# Patient Record
Sex: Female | Born: 1950 | Race: White | Hispanic: No | Marital: Married | Smoking: Never smoker
Health system: Southern US, Community
[De-identification: ages and names within clinical notes are randomized; demographics above are authoritative.]

## PROBLEM LIST (undated history)

## (undated) DIAGNOSIS — E079 Disorder of thyroid, unspecified: Secondary | ICD-10-CM

## (undated) DIAGNOSIS — IMO0002 Reserved for concepts with insufficient information to code with codable children: Secondary | ICD-10-CM

## (undated) DIAGNOSIS — R000 Tachycardia, unspecified: Secondary | ICD-10-CM

## (undated) DIAGNOSIS — H269 Unspecified cataract: Secondary | ICD-10-CM

## (undated) DIAGNOSIS — T7840XA Allergy, unspecified, initial encounter: Secondary | ICD-10-CM

## (undated) HISTORY — PX: BREAST REDUCTION SURGERY: SHX8

## (undated) HISTORY — DX: Reserved for concepts with insufficient information to code with codable children: IMO0002

## (undated) HISTORY — DX: Disorder of thyroid, unspecified: E07.9

## (undated) HISTORY — DX: Unspecified cataract: H26.9

## (undated) HISTORY — PX: CHOLECYSTECTOMY: SHX55

## (undated) HISTORY — DX: Allergy, unspecified, initial encounter: T78.40XA

## (undated) HISTORY — PX: LASIK: SHX215

## (undated) HISTORY — PX: COLONOSCOPY: SHX174

## (undated) HISTORY — DX: Tachycardia, unspecified: R00.0

## (undated) HISTORY — PX: BREAST LUMPECTOMY: SHX2

## (undated) HISTORY — PX: TUBAL LIGATION: SHX77

---

## 2010-07-15 DIAGNOSIS — M353 Polymyalgia rheumatica: Secondary | ICD-10-CM

## 2010-07-15 HISTORY — DX: Polymyalgia rheumatica: M35.3

## 2013-08-23 ENCOUNTER — Ambulatory Visit (INDEPENDENT_AMBULATORY_CARE_PROVIDER_SITE_OTHER): Payer: BC Managed Care – PPO | Admitting: Podiatry

## 2013-08-23 ENCOUNTER — Ambulatory Visit (INDEPENDENT_AMBULATORY_CARE_PROVIDER_SITE_OTHER): Payer: BC Managed Care – PPO

## 2013-08-23 ENCOUNTER — Encounter: Payer: Self-pay | Admitting: Podiatry

## 2013-08-23 VITALS — BP 115/70 | HR 96 | Resp 16 | Ht 68.0 in | Wt 200.0 lb

## 2013-08-23 DIAGNOSIS — M779 Enthesopathy, unspecified: Secondary | ICD-10-CM

## 2013-08-23 DIAGNOSIS — M722 Plantar fascial fibromatosis: Secondary | ICD-10-CM

## 2013-08-23 DIAGNOSIS — M7660 Achilles tendinitis, unspecified leg: Secondary | ICD-10-CM

## 2013-08-23 MED ORDER — TRIAMCINOLONE ACETONIDE 10 MG/ML IJ SUSP
10.0000 mg | Freq: Once | INTRAMUSCULAR | Status: AC
  Administered 2013-08-23: 10 mg

## 2013-08-23 MED ORDER — TRIAMCINOLONE ACETONIDE 10 MG/ML IJ SUSP: 10.0000 mg | Freq: Once | INTRAMUSCULAR | Status: DC

## 2013-08-23 MED ORDER — DICLOFENAC SODIUM 75 MG PO TBEC: 75.0000 mg | DELAYED_RELEASE_TABLET | Freq: Two times a day (BID) | ORAL | Status: DC

## 2013-08-23 NOTE — Patient Instructions (Signed)

## 2013-08-23 NOTE — Progress Notes (Signed)
   Subjective:    Patient ID: Anita Nguyen, female    DOB: 07/11/1951, 63 y.o.   MRN: [id number]  HPI Comments: "My feet are hurting, hope he can fix it"  Patient states that she has sharp pain in plantar heel right for about 6 months and is getting worse. She tries to walk for exercise but is unable to now. Driving, flat shoes, and walking a lot aggravates the problem. She says that her friend wears a night splint and says it helps and is interested in that. She says she tries to keep feet in a 90 degree position and it helps. Wears sneakers when she can and that relieves some pain. Her back doc has her wear a heel left in right shoe and it is now uncomfortable.  Patient says she also has intermittent pain in the achilles area and plantar forefoot. No swelling or injury. Thinks maybe compensation.  Foot Pain      Review of Systems  All other systems reviewed and are negative.       Objective:   Physical Exam        Assessment & Plan:

## 2013-08-24 ENCOUNTER — Telehealth: Payer: Self-pay | Admitting: *Deleted

## 2013-08-24 MED ORDER — DICLOFENAC SODIUM 75 MG PO TBEC: 75.0000 mg | DELAYED_RELEASE_TABLET | Freq: Two times a day (BID) | ORAL | Status: DC

## 2013-08-24 NOTE — Telephone Encounter (Signed)
Pt states [facility] in [place] is not the pharmacy she wants to use.  Please change the Voltaren 75mg  to [facility] in [place].  Rx sent to [facility] electronically.

## 2013-08-24 NOTE — Progress Notes (Signed)
Subjective:     Patient ID: Anita Nguyen, female   DOB: 1951-03-13, 63 y.o.   MRN: [id number]  Foot Pain   patient presents stating she's had about 6 month history of plantar pain in the right heel that is gradually getting more aggravating for her and making walking difficult. She's tried changes in shoe gear and changes in activity levels to help   Review of Systems  All other systems reviewed and are negative.       Objective:   Physical Exam  Nursing note and vitals reviewed. Constitutional: She is oriented to person, place, and time.  Cardiovascular: Intact distal pulses.   Musculoskeletal: Normal range of motion.  Neurological: She is oriented to person, place, and time.  Skin: Skin is warm.   neurovascular status is intact with normal muscle strength and no equinus condition noted of both feet. I noted exquisite discomfort plantar aspect right heel at the insertional point of the tendon into the calcaneus with inflammation and fluid around the medial band    Assessment:     Plantar fasciitis acute nature right heel with some compensation Achilles and forefoot    Plan:     H&P and x-ray reviewed. Injected the right plantar fascia 3 mg Kenalog 5 monocytes Marcaine mixture and dispensed fascially brace with instructions on usage. Placed on anti-inflammatory and reappoint one week

## 2013-09-02 ENCOUNTER — Ambulatory Visit: Payer: BC Managed Care – PPO | Admitting: Podiatry

## 2013-09-13 ENCOUNTER — Ambulatory Visit (INDEPENDENT_AMBULATORY_CARE_PROVIDER_SITE_OTHER): Payer: BC Managed Care – PPO | Admitting: Podiatry

## 2013-09-13 ENCOUNTER — Encounter: Payer: Self-pay | Admitting: Podiatry

## 2013-09-13 VITALS — BP 117/78 | HR 93 | Resp 16

## 2013-09-13 DIAGNOSIS — M722 Plantar fascial fibromatosis: Secondary | ICD-10-CM

## 2013-09-15 NOTE — Progress Notes (Signed)
Subjective:     Patient ID: Anita Nguyen, female   DOB: September 09, 1950, 63 y.o.   MRN: [id number]  HPI patient presents with heel pain that has improved but is still sore. She is on her feet a lot and develops discomfort with activity and cement floors and feels like her feet are not supported well   Review of Systems     Objective:   Physical Exam Neurovascular status was found to be intact with moderate inflammation in the plantar heel at the insertional point of the tendon into the calcaneus    Assessment:     Plantar fasciitis of the heel left that is improved but still present with mechanical dysfunction of the arch noted    Plan:     Reviewed physical therapy and also utilization of supportive shoe gear. Scanned for custom orthotics to reduce stress against the plantar feet

## 2013-10-29 ENCOUNTER — Ambulatory Visit (INDEPENDENT_AMBULATORY_CARE_PROVIDER_SITE_OTHER): Payer: BC Managed Care – PPO | Admitting: *Deleted

## 2013-10-29 DIAGNOSIS — M779 Enthesopathy, unspecified: Secondary | ICD-10-CM

## 2013-10-29 NOTE — Patient Instructions (Signed)

## 2013-10-29 NOTE — Progress Notes (Signed)
   Subjective:    Patient ID: Anita Nguyen, female    DOB: 05/03/1951, 62 y.o.   MRN: [id number]  HPI PICK UP ORTHOTICS AND GIVEN INSTRUCTION.   Review of Systems     Objective:   Physical Exam        Assessment & Plan:   

## 2013-12-02 ENCOUNTER — Ambulatory Visit: Payer: BC Managed Care – PPO | Admitting: Podiatry

## 2013-12-08 ENCOUNTER — Ambulatory Visit (INDEPENDENT_AMBULATORY_CARE_PROVIDER_SITE_OTHER): Payer: BC Managed Care – PPO | Admitting: Podiatry

## 2013-12-08 ENCOUNTER — Encounter: Payer: Self-pay | Admitting: Podiatry

## 2013-12-08 DIAGNOSIS — M722 Plantar fascial fibromatosis: Secondary | ICD-10-CM

## 2013-12-08 NOTE — Progress Notes (Signed)
Subjective:     Patient ID: Anita Nguyen, female   DOB: 17-Oct-1950, 63 y.o.   MRN: [id number]  HPI patient states that her orthotics do not fit in dress shoes plantar heel it bothers her time   Review of Systems     Objective:   Physical Exam Vascular status unchanged with pain in the plantar right heel the moderate nature and orthotics to do well in 10 issues but not in her dress shoes    Assessment:     Continue plantar fasciitis right with problems with dress shoes    Plan:     Acute condition and the difficulty of the tight shoe she wears but we are to try to make a different type of an orthotic. We scanned her and will have a dress-type orthotic made

## 2013-12-31 ENCOUNTER — Ambulatory Visit (INDEPENDENT_AMBULATORY_CARE_PROVIDER_SITE_OTHER): Payer: BC Managed Care – PPO | Admitting: *Deleted

## 2013-12-31 DIAGNOSIS — M722 Plantar fascial fibromatosis: Secondary | ICD-10-CM

## 2013-12-31 NOTE — Patient Instructions (Signed)

## 2013-12-31 NOTE — Progress Notes (Signed)
   Subjective:    Patient ID: Anita Nguyen, female    DOB: 10-11-1950, 63 y.o.   MRN: [id number]  HPI PICK UP ORTHOTICS AND GIVEN INSTRUCTION.   Review of Systems     Objective:   Physical Exam        Assessment & Plan:

## 2014-07-13 DIAGNOSIS — M722 Plantar fascial fibromatosis: Secondary | ICD-10-CM

## 2015-06-19 ENCOUNTER — Ambulatory Visit: Payer: Self-pay | Admitting: Podiatry

## 2017-05-08 ENCOUNTER — Ambulatory Visit (INDEPENDENT_AMBULATORY_CARE_PROVIDER_SITE_OTHER): Payer: BLUE CROSS/BLUE SHIELD

## 2017-05-08 ENCOUNTER — Ambulatory Visit (INDEPENDENT_AMBULATORY_CARE_PROVIDER_SITE_OTHER): Payer: BLUE CROSS/BLUE SHIELD | Admitting: Podiatry

## 2017-05-08 DIAGNOSIS — M722 Plantar fascial fibromatosis: Secondary | ICD-10-CM

## 2017-05-08 DIAGNOSIS — M2011 Hallux valgus (acquired), right foot: Secondary | ICD-10-CM

## 2017-05-08 DIAGNOSIS — D169 Benign neoplasm of bone and articular cartilage, unspecified: Secondary | ICD-10-CM

## 2017-05-08 DIAGNOSIS — M2012 Hallux valgus (acquired), left foot: Secondary | ICD-10-CM

## 2017-05-08 NOTE — Progress Notes (Signed)
Subjective:    Patient ID: Anita Nguyen, female   DOB: 66 y.o.   MRN: [id number]   HPI patient presents stating I need new orthotics and I'm having a lot of pain on the side of my first metatarsal left and on top of my left foot with bone spur formation that's making it difficult to wear shoe gear comfortably    Review of Systems  All other systems reviewed and are negative.       Objective:  Physical Exam  Constitutional: She appears well-developed and well-nourished.  Cardiovascular: Intact distal pulses.   Pulmonary/Chest: Effort normal.  Musculoskeletal: Normal range of motion.  Neurological: She is alert.  Skin: Skin is warm.  Nursing note and vitals reviewed.  neurovascular status intact muscle strength adequate range of motion within normal limits with patient found to have hyperostosis with pain first metatarsal left with a spur on top of the first metatarsocuneiform joint left it's painful when pressed. 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 Due to long-standing nature I do think this would be best corrected and I have recommended a modified type McBride bunionectomy left and removal of bone spur left. Patient wants surgery and we also discussed long-term orthotics and we'll order her a new pair of orthotics for the postoperative period patient is allowed to read consent form going over alternative treatments complications and the surgery and the fact total recovery can take approximate 6 months. Patient is comfortable with this wants surgery signed consent form is given all preoperative instructions and is encouraged to call with any questions prior to procedure  X-rays indicate enlargement of the first metatarsal head left  with dorsal spurring metatarsocuneiform joint left

## 2017-05-08 NOTE — Patient Instructions (Signed)

## 2017-05-09 ENCOUNTER — Telehealth: Payer: Self-pay | Admitting: *Deleted

## 2017-05-09 NOTE — Telephone Encounter (Signed)
"  I am going to have surgery on my left foot.  I don't know what to do next.  I want to wait on doing my surgery.  I have a $400 deductible that I haven't met.  I want to get some orthotics.  I would like to do everything in the same year so I won't have to make that deductible twice.  Please give me a call and let me know what I need to do."

## 2017-05-15 NOTE — Telephone Encounter (Signed)
I attempted to call the patient to find out what she decided to do.  I want to know if she's going to have the surgery this year or wait until next year.  Her voicemail was full, I couldn't leave a message.

## 2017-08-26 ENCOUNTER — Emergency Department (INDEPENDENT_AMBULATORY_CARE_PROVIDER_SITE_OTHER)
Admission: EM | Admit: 2017-08-26 | Discharge: 2017-08-26 | Disposition: A | Discharge status: Home / Self Care | Payer: BLUE CROSS/BLUE SHIELD | Source: Home / Self Care | Attending: Family Medicine | Admitting: Family Medicine

## 2017-08-26 ENCOUNTER — Other Ambulatory Visit: Payer: Self-pay

## 2017-08-26 DIAGNOSIS — J069 Acute upper respiratory infection, unspecified: Secondary | ICD-10-CM | POA: Diagnosis not present

## 2017-08-26 DIAGNOSIS — B9789 Other viral agents as the cause of diseases classified elsewhere: Secondary | ICD-10-CM

## 2017-08-26 LAB — POCT RAPID STREP A (OFFICE): Rapid Strep A Screen: NEGATIVE

## 2017-08-26 MED ORDER — AZITHROMYCIN 250 MG PO TABS: ORAL_TABLET | ORAL | 0 refills | Status: DC

## 2017-08-26 NOTE — ED Triage Notes (Signed)
Pt had cold sx 3 weeks ago, and they will not go away.  She has a cough and scratchy throat, and been exposed to strep.

## 2017-08-26 NOTE — Discharge Instructions (Signed)
Take plain guaifenesin (600mg  or1200mg  extended release tabs such as Mucinex) twice daily, with plenty of water, for cough and congestion.  May add Pseudoephedrine (30mg , one or two every 4 to 6 hours) for sinus congestion.  Get adequate rest.   May use Afrin nasal spray (or generic oxymetazoline) each morning for about 5 days and then discontinue.  Also recommend using saline nasal spray several times daily and saline nasal irrigation (AYR is a common brand).  Use Flonase nasal spray each morning after using Afrin nasal spray and saline nasal irrigation. Try warm salt water gargles for sore throat.  Stop all antihistamines for now, and other non-prescription cough/cold preparations. May take Delsym Cough Suppressant at bedtime for nighttime cough.

## 2017-08-26 NOTE — ED Provider Notes (Signed)
[name]    CSN: [id number] Arrival date & time: 08/26/17  1355     History   Chief Complaint Chief Complaint  Patient presents with  . Cough  . Nasal Congestion    HPI Anita Nguyen is a 67 y.o. female.   Patient reports that she had typical cold symptoms three weeks ago that seemed to be resolving.  Five days ago she developed sore throat, post-nasal drainage, frontal headache, and increased cough.  No fevers, chills, and sweats. She has a history of seasonal rhinitis.   The history is provided by the patient.    Past Medical History:  Diagnosis Date  . DDD (degenerative disc disease)   . Tachycardia   . Thyroid disease     There are no active problems to display for this patient.   Past Surgical History:  Procedure Laterality Date  . BREAST LUMPECTOMY    . BREAST REDUCTION SURGERY    . CHOLECYSTECTOMY    . LASIK      OB History    No data available       Home Medications    Prior to Admission medications   Medication Sig Start Date End Date Taking? Authorizing Provider  azithromycin (ZITHROMAX Z-PAK) 250 MG tablet Take 2 tabs today; then begin one tab once daily for 4 more days. 08/26/17   Kandra Nicolas, MD  Calcium Acetate, Phos Binder, (CALCIUM ACETATE PO) Take by mouth.    [provider]  Cholecalciferol (VITAMIN D PO) Take by mouth.    [provider]  diclofenac (VOLTAREN) 75 MG EC tablet Take 1 tablet (75 mg total) by mouth 2 (two) times daily. 08/24/13   Wallene Huh, DPM  levothyroxine (SYNTHROID, LEVOTHROID) 137 MCG tablet  07/24/13   [provider]  MAGNESIUM CARBONATE PO Take by mouth.    [provider]    Family History History reviewed. No pertinent family history.  Social History Social History   Tobacco Use  . Smoking status: Never Smoker  Substance Use Topics  . Alcohol use: No  . Drug use: Not on file     Allergies   Penicillins   Review of Systems Review of  Systems + sore throat + cough No pleuritic pain No wheezing + nasal congestion + post-nasal drainage No sinus pain/pressure No itchy/red eyes No earache No hemoptysis No SOB No fever, + chills No nausea No vomiting No abdominal pain No diarrhea No urinary symptoms No skin rash + fatigue No myalgias + headache Used OTC meds without relief   Physical Exam Triage Vital Signs ED Triage Vitals  Enc Vitals Group     BP 08/26/17 1544 (!) 143/84     Pulse Rate 08/26/17 1544 92     Resp --      Temp 08/26/17 1544 97.8 F (36.6 C)     Temp Source 08/26/17 1544 Oral     SpO2 08/26/17 1544 97 %     Weight 08/26/17 1545 216 lb (98 kg)     Height 08/26/17 1545 5\' 8"  (1.727 m)     Head Circumference --      Peak Flow --      Pain Score 08/26/17 1545 0     Pain Loc --      Pain Edu? --      Excl. in [place]? --    No data found.  Updated Vital Signs BP (!) 143/84 (BP Location: Right Arm)   Pulse 92  Temp 97.8 F (36.6 C) (Oral)   Ht 5\' 8"  (1.727 m)   Wt 216 lb (98 kg)   SpO2 97%   BMI 32.84 kg/m   Visual Acuity Right Eye Distance:   Left Eye Distance:   Bilateral Distance:    Right Eye Near:   Left Eye Near:    Bilateral Near:     Physical Exam Nursing notes and Vital Signs reviewed. Appearance:  Patient appears stated age, and in no acute distress Eyes:  Pupils are equal, round, and reactive to light and accomodation.  Extraocular movement is intact.  Conjunctivae are not inflamed  Ears:  Canals normal.  Tympanic membranes normal.  Nose:  Mildly congested turbinates.  No sinus tenderness.    Pharynx:  Uvula erythematous  Neck:  Supple.  Enlarged posterior/lateral nodes are palpated bilaterally, tender to palpation on the left.   Lungs:  Clear to auscultation.  Breath sounds are equal.  Moving air well. Heart:  Regular rate and rhythm without murmurs, rubs, or gallops.  Abdomen:  Nontender without masses or hepatosplenomegaly.  Bowel sounds are present.  No CVA  or flank tenderness.  Extremities:  No edema.  Skin:  No rash present.    UC Treatments / Results  Labs (all labs ordered are listed, but only abnormal results are displayed) Labs Reviewed  STREP A DNA PROBE  POCT RAPID STREP A (OFFICE) negative    EKG  EKG Interpretation None       Radiology No results found.  Procedures Procedures (including critical care time)  Medications Ordered in UC Medications - No data to display   Initial Impression / Assessment and Plan / UC Course  I have reviewed the triage vital signs and the nursing notes.  Pertinent labs & imaging results that were available during my care of the patient were reviewed by me and considered in my medical decision making (see chart for details).    Suspect a second viral URI. Throat culture pending. Begin empiric Z-pak. Take plain guaifenesin (600mg  or1200mg  extended release tabs such as Mucinex) twice daily, with plenty of water, for cough and congestion.  May add Pseudoephedrine (30mg , one or two every 4 to 6 hours) for sinus congestion.  Get adequate rest.   May use Afrin nasal spray (or generic oxymetazoline) each morning for about 5 days and then discontinue.  Also recommend using saline nasal spray several times daily and saline nasal irrigation (AYR is a common brand).  Use Flonase nasal spray each morning after using Afrin nasal spray and saline nasal irrigation. Try warm salt water gargles for sore throat.  Stop all antihistamines for now, and other non-prescription cough/cold preparations. May take Delsym Cough Suppressant at bedtime for nighttime cough.  Followup with Family Doctor if not improved in one week.     Final Clinical Impressions(s) / UC Diagnoses   Final diagnoses:  Viral URI with cough    ED Discharge Orders        Ordered    azithromycin (ZITHROMAX Z-PAK) 250 MG tablet     08/26/17 1630           Kandra Nicolas, MD 09/06/17 1100

## 2017-08-27 ENCOUNTER — Telehealth: Payer: Self-pay

## 2017-08-27 LAB — STREP A DNA PROBE: Group A Strep Probe: NOT DETECTED

## 2017-08-27 NOTE — Telephone Encounter (Signed)
Still feeling bad.  Given lab results.  Will follow up as needed.

## 2019-08-23 ENCOUNTER — Encounter: Payer: Self-pay | Admitting: Family Medicine

## 2019-08-23 ENCOUNTER — Ambulatory Visit (INDEPENDENT_AMBULATORY_CARE_PROVIDER_SITE_OTHER): Payer: Medicare Other | Admitting: Family Medicine

## 2019-08-23 ENCOUNTER — Other Ambulatory Visit: Payer: Self-pay

## 2019-08-23 VITALS — BP 121/75 | HR 80 | Temp 97.7°F | Wt 193.1 lb

## 2019-08-23 DIAGNOSIS — Z780 Asymptomatic menopausal state: Secondary | ICD-10-CM | POA: Insufficient documentation

## 2019-08-23 DIAGNOSIS — R21 Rash and other nonspecific skin eruption: Secondary | ICD-10-CM | POA: Diagnosis not present

## 2019-08-23 DIAGNOSIS — Z13820 Encounter for screening for osteoporosis: Secondary | ICD-10-CM | POA: Insufficient documentation

## 2019-08-23 DIAGNOSIS — Z1211 Encounter for screening for malignant neoplasm of colon: Secondary | ICD-10-CM | POA: Diagnosis not present

## 2019-08-23 NOTE — Assessment & Plan Note (Signed)
Refer to GI for updated colon cancer screening.

## 2019-08-23 NOTE — Assessment & Plan Note (Signed)
Rash appears most consistent with contact/irritant dermatitis.  She has had some relief with benadryl cream.  Recommend using cortisone cream instead for now, avoiding eyes/eyelids.  I think the rash is likely coincidental rather than causative from the COVID-19 vaccine.  I did recommend she stay an additional 15 minutes after second vaccine.  Recommend that she check cosmetic products for quaternium-15 or similar compounds.  Call for new or worsening symptoms.

## 2019-08-23 NOTE — Patient Instructions (Addendum)
Try using cortisone cream on the face.  Avoid getting on the eyelids.  Check to see if any of your make-up or hair products contain quaternium-15 I think you should be ok to get your 2nd COVID vaccine, I would recommend having them monitor you for 30 minutes afterwards.   I have entered orders for update colonoscopy and bone density test.  You should wait at least 14 days before receiving other vaccines (shingles/pneumonia) after your COVID vaccine

## 2019-08-23 NOTE — Assessment & Plan Note (Signed)
Needs bone density screening, ordered.

## 2019-08-23 NOTE — Progress Notes (Signed)
Anita Nguyen - 69 y.o. female MRN [id number]  Date of birth: July 24, 1950  Subjective Chief Complaint  Patient presents with  . Establish Care    HPI Anita Nguyen is a 69 y.o. female with history of hypothyroidism her today for initial visit and c/o facial rash.  She reports that symptoms started about 2 days ago.  Rash is red and itchy and located only around face.  She denies pain associated with rash.  She received COVID-19 vaccine on 08/16/19, not sure if this is related.  She denies any significant side effects 1-2 days after vaccine.  She has no prior reactions to vaccines.  She does report that she recently switched her laundry detergent, started using a new makeup and the conditioner that she uses has caused rash to her face in the past.    She also reports that she is overdue for colonoscopy and would like referral.  She is due for bone density test.   ROS:  A comprehensive ROS was completed and negative except as noted per HPI  Allergies  Allergen Reactions  . Penicillins     Past Medical History:  Diagnosis Date  . DDD (degenerative disc disease)   . Tachycardia   . Thyroid disease     Past Surgical History:  Procedure Laterality Date  . BREAST LUMPECTOMY    . BREAST REDUCTION SURGERY    . CHOLECYSTECTOMY    . LASIK    . TUBAL LIGATION      Social History   Socioeconomic History  . Marital status: Married    Spouse name: Not on file  . Number of children: 1  . Years of education: Not on file  . Highest education level: Not on file  Occupational History  . Occupation: Water engineer: [employer]  Tobacco Use  . Smoking status: Never Smoker  . Smokeless tobacco: Never Used  Substance and Sexual Activity  . Alcohol use: No  . Drug use: Never  . Sexual activity: Yes    Partners: Male    Birth control/protection: Post-menopausal  Other Topics Concern  . Not on file  Social History Narrative  . Not on file   Social Determinants of Health   Financial  Resource Strain:   . Difficulty of Paying Living Expenses: Not on file  Food Insecurity:   . Worried About Charity fundraiser in the Last Year: Not on file  . Ran Out of Food in the Last Year: Not on file  Transportation Needs:   . Lack of Transportation (Medical): Not on file  . Lack of Transportation (Non-Medical): Not on file  Physical Activity:   . Days of Exercise per Week: Not on file  . Minutes of Exercise per Session: Not on file  Stress:   . Feeling of Stress : Not on file  Social Connections:   . Frequency of Communication with Friends and Family: Not on file  . Frequency of Social Gatherings with Friends and Family: Not on file  . Attends Religious Services: Not on file  . Active Member of Clubs or Organizations: Not on file  . Attends Archivist Meetings: Not on file  . Marital Status: Not on file    Family History  Problem Relation Age of Onset  . Dementia Mother   . Dementia Sister   . Stomach cancer Father     Health Maintenance  Topic Date Due  . Hepatitis C Screening  1951/01/15  . MAMMOGRAM  02/17/2003  .  COLONOSCOPY  02/17/2003  . DEXA SCAN  02/16/2018  . PNA vac Low Risk Adult (1 of 2 - PCV13) 02/16/2018  . TETANUS/TDAP  08/22/2020 (Originally 02/17/1972)  . INFLUENZA VACCINE  Completed    ----------------------------------------------------------------------------------------------------------------------------------------------------------------------------------------------------------------- Physical Exam BP 121/75 (BP Location: Left Arm, Patient Position: Sitting, Cuff Size: Normal)   Pulse 80   Temp 97.7 F (36.5 C) (Oral)   Wt 193 lb 1.9 oz (87.6 kg)   BMI 29.36 kg/m   Physical Exam Constitutional:      Appearance: Normal appearance.  HENT:     Head: Normocephalic and atraumatic.  Eyes:     General: No scleral icterus. Cardiovascular:     Rate and Rhythm: Normal rate.  Pulmonary:     Effort: Pulmonary effort is normal.      Breath sounds: Normal breath sounds.  Musculoskeletal:     Cervical back: Neck supple.  Skin:    Comments: Erythema throughout face without swelling, hives or periorbital edema.   Neurological:     General: No focal deficit present.     Mental Status: She is alert.  Psychiatric:        Mood and Affect: Mood normal.        Behavior: Behavior normal.     ------------------------------------------------------------------------------------------------------------------------------------------------------------------------------------------------------------------- Assessment and Plan  Rash Rash appears most consistent with contact/irritant dermatitis.  She has had some relief with benadryl cream.  Recommend using cortisone cream instead for now, avoiding eyes/eyelids.  I think the rash is likely coincidental rather than causative from the COVID-19 vaccine.  I did recommend she stay an additional 15 minutes after second vaccine.  Recommend that she check cosmetic products for quaternium-15 or similar compounds.  Call for new or worsening symptoms.   Colon cancer screening Refer to GI for updated colon cancer screening.   Post-menopausal Needs bone density screening, ordered.      This visit occurred during the SARS-CoV-2 public health emergency.  Safety protocols were in place, including screening questions prior to the visit, additional usage of staff PPE, and extensive cleaning of exam room while observing appropriate contact time as indicated for disinfecting solutions.

## 2019-08-25 ENCOUNTER — Ambulatory Visit (INDEPENDENT_AMBULATORY_CARE_PROVIDER_SITE_OTHER): Payer: Medicare Other

## 2019-08-25 ENCOUNTER — Other Ambulatory Visit: Payer: Self-pay

## 2019-08-25 DIAGNOSIS — Z13820 Encounter for screening for osteoporosis: Secondary | ICD-10-CM | POA: Diagnosis not present

## 2019-08-25 DIAGNOSIS — Z780 Asymptomatic menopausal state: Secondary | ICD-10-CM

## 2019-08-30 ENCOUNTER — Encounter: Payer: Self-pay | Admitting: Gastroenterology

## 2019-09-21 ENCOUNTER — Other Ambulatory Visit: Payer: Self-pay

## 2019-09-21 ENCOUNTER — Ambulatory Visit (AMBULATORY_SURGERY_CENTER): Payer: Self-pay | Admitting: *Deleted

## 2019-09-21 VITALS — Temp 96.6°F | Ht 68.0 in | Wt 187.0 lb

## 2019-09-21 DIAGNOSIS — Z1211 Encounter for screening for malignant neoplasm of colon: Secondary | ICD-10-CM

## 2019-09-21 NOTE — Progress Notes (Signed)
No egg or soy allergy known to patient  No issues with past sedation with any surgeries  or procedures, no intubation problems  No diet pills per patient No home 02 use per patient  No blood thinners per patient  Pt denies issues with constipation  No A fib or A flutter  EMMI video sent to pt's e mail   Due to the COVID-19 pandemic we are asking patients to follow these guidelines. Please only bring one care partner. Please be aware that your care partner may wait in the car in the parking lot or if they feel like they will be too hot to wait in the car, they may wait in the lobby on the 4th floor. All care partners are required to wear a mask the entire time (we do not have any that we can provide them), they need to practice social distancing, and we will do a Covid check for all patient's and care partners when you arrive. Also we will check their temperature and your temperature. If the care partner waits in their car they need to stay in the parking lot the entire time and we will call them on their cell phone when the patient is ready for discharge so they can bring the car to the front of the building. Also all patient's will need to wear a mask into building.  Pt completed COVID vaccines 09-14-2019 so will not require a pre covid screen for her colon 3-23

## 2019-10-05 ENCOUNTER — Encounter: Payer: Self-pay | Admitting: Gastroenterology

## 2019-10-05 ENCOUNTER — Encounter: Payer: Medicare Other | Admitting: Gastroenterology

## 2019-10-05 ENCOUNTER — Other Ambulatory Visit: Payer: Self-pay

## 2019-10-05 ENCOUNTER — Ambulatory Visit (AMBULATORY_SURGERY_CENTER): Payer: Medicare Other | Admitting: Gastroenterology

## 2019-10-05 VITALS — BP 111/59 | HR 69 | Temp 97.1°F | Resp 12 | Ht 68.0 in | Wt 194.6 lb

## 2019-10-05 DIAGNOSIS — Z1211 Encounter for screening for malignant neoplasm of colon: Secondary | ICD-10-CM | POA: Diagnosis present

## 2019-10-05 DIAGNOSIS — D122 Benign neoplasm of ascending colon: Secondary | ICD-10-CM | POA: Diagnosis not present

## 2019-10-05 MED ORDER — SODIUM CHLORIDE 0.9 % IV SOLN: 500.0000 mL | INTRAVENOUS | Status: DC

## 2019-10-05 NOTE — Op Note (Signed)
Anita Nguyen Patient Name: Anita Nguyen Procedure Date: 10/05/2019 10:13 AM MRN: [id number] Endoscopist: Jackquline Denmark , MD Age: 69 Referring MD:  Date of Birth: 1950-10-19 Gender: Female Account #: [id number] Procedure:                Colonoscopy Indications:              Screening for colorectal malignant neoplasm Medicines:                Monitored Anesthesia Care Procedure:                Pre-Anesthesia Assessment:                           - Prior to the procedure, a History and Physical                            was performed, and patient medications and                            allergies were reviewed. The patient's tolerance of                            previous anesthesia was also reviewed. The risks                            and benefits of the procedure and the sedation                            options and risks were discussed with the patient.                            All questions were answered, and informed consent                            was obtained. Prior Anticoagulants: The patient has                            taken no previous anticoagulant or antiplatelet                            agents. ASA Grade Assessment: II - A patient with                            mild systemic disease. After reviewing the risks                            and benefits, the patient was deemed in                            satisfactory condition to undergo the procedure.                           After obtaining informed consent, the colonoscope  was passed under direct vision. Throughout the                            procedure, the patient's blood pressure, pulse, and                            oxygen saturations were monitored continuously. The                            Colonoscope was introduced through the anus and                            advanced to the 2 cm into the ileum. The                            colonoscopy was performed  without difficulty. The                            patient tolerated the procedure well. The quality                            of the bowel preparation was good. The terminal                            ileum, ileocecal valve, appendiceal orifice, and                            rectum were photographed. Scope In: 10:18:30 AM Scope Out: 10:37:58 AM Scope Withdrawal Time: 0 hours 14 minutes 13 seconds  Total Procedure Duration: 0 hours 19 minutes 28 seconds  Findings:                 A 6 mm polyp was found in the mid ascending colon.                            The polyp was sessile. The polyp was removed with a                            cold snare. Resection and retrieval were complete.                           A few small-mouthed diverticula were found in the                            sigmoid colon.                           Non-bleeding internal hemorrhoids were found during                            retroflexion and during perianal exam. The                            hemorrhoids were small.  The terminal ileum appeared normal.                           The exam was otherwise without abnormality on                            direct and retroflexion views. Complications:            No immediate complications. Estimated Blood Loss:     Estimated blood loss: none. Impression:               - One 6 mm polyp in the mid ascending colon,                            removed with a cold snare. Resected and retrieved.                           - Mild sigmoid diverticulosis.                           - Non-bleeding internal hemorrhoids.                           - Otherwise normal colonoscopy to TI. Recommendation:           - Patient has a contact number available for                            emergencies. The signs and symptoms of potential                            delayed complications were discussed with the                            patient. Return to normal  activities tomorrow.                            Written discharge instructions were provided to the                            patient.                           - High fiber diet.                           - Continue present medications.                           - Await pathology results.                           - Repeat colonoscopy for surveillance based on                            pathology results.                           -  Preparation H on as needed basis.                           - The findings and recommendations were discussed                            with the patient's family. Jackquline Denmark, MD 10/05/2019 10:42:05 AM This report has been signed electronically.

## 2019-10-05 NOTE — Patient Instructions (Signed)
YOU HAD AN ENDOSCOPIC PROCEDURE TODAY AT [facility]:   Refer to the procedure report that was given to you for any specific questions about what was found during the examination.  If the procedure report does not answer your questions, please call your gastroenterologist to clarify.  If you requested that your care partner not be given the details of your procedure findings, then the procedure report has been included in a sealed envelope for you to review at your convenience later.  YOU SHOULD EXPECT: Some feelings of bloating in the abdomen. Passage of more gas than usual.  Walking can help get rid of the air that was put into your GI tract during the procedure and reduce the bloating. If you had a lower endoscopy (such as a colonoscopy or flexible sigmoidoscopy) you may notice spotting of blood in your stool or on the toilet paper. If you underwent a bowel prep for your procedure, you may not have a normal bowel movement for a few days.  Please Note:  You might notice some irritation and congestion in your nose or some drainage.  This is from the oxygen used during your procedure.  There is no need for concern and it should clear up in a day or so.  SYMPTOMS TO REPORT IMMEDIATELY:   Following lower endoscopy (colonoscopy or flexible sigmoidoscopy):  Excessive amounts of blood in the stool  Significant tenderness or worsening of abdominal pains  Swelling of the abdomen that is new, acute  Fever of 100F or higher  For urgent or emergent issues, a gastroenterologist can be reached at any hour by calling [phone]. Do not use MyChart messaging for urgent concerns.    DIET:  We do recommend a small meal at first, but then you may proceed to your regular diet.  Drink plenty of fluids but you should avoid alcoholic beverages for 24 hours.  ACTIVITY:  You should plan to take it easy for the rest of today and you should NOT DRIVE or use heavy machinery until tomorrow (because  of the sedation medicines used during the test).    FOLLOW UP: Our staff will call the number listed on your records 48-72 hours following your procedure to check on you and address any questions or concerns that you may have regarding the information given to you following your procedure. If we do not reach you, we will leave a message.  We will attempt to reach you two times.  During this call, we will ask if you have developed any symptoms of COVID 19. If you develop any symptoms (ie: fever, flu-like symptoms, shortness of breath, cough etc.) before then, please call [phone].  If you test positive for Covid 19 in the 2 weeks post procedure, please call and report this information to [place].    If any biopsies were taken you will be contacted by phone or by letter within the next 1-3 weeks.  Please call us at [phone] if you have not heard about the biopsies in 3 weeks.    SIGNATURES/CONFIDENTIALITY: You and/or your care partner have signed paperwork which will be entered into your electronic medical record.  These signatures attest to the fact that that the information above on your After Visit Summary has been reviewed and is understood.  Full responsibility of the confidentiality of this discharge information lies with you and/or your care-partner.  Await pathology  Please read over handouts about polyps, diverticulosis, hemorrhoids and high fiber diets  Apply Preparation  H as needed for hemorrhoidal discomfort  Continue your normal medications

## 2019-10-05 NOTE — Progress Notes (Signed)
Report to PACU, RN, vss, BBS= Clear.  

## 2019-10-05 NOTE — Progress Notes (Signed)
Called to room to assist during endoscopic procedure.  Patient ID and intended procedure confirmed with present staff. Received instructions for my participation in the procedure from the performing physician.  

## 2019-10-07 ENCOUNTER — Telehealth: Payer: Self-pay

## 2019-10-07 NOTE — Telephone Encounter (Signed)
  Follow up Call-  Call back number 10/05/2019  Post procedure Call Back phone  # [phone]- husband's number -ok to ask him about pt  Permission to leave phone message Yes  Some recent data might be hidden     Patient questions:  Do you have a fever, pain , or abdominal swelling? No. Pain Score  0 *  Have you tolerated food without any problems? Yes.    Have you been able to return to your normal activities? Yes.    Do you have any questions about your discharge instructions: Diet   No. Medications  No. Follow up visit  No.  Do you have questions or concerns about your Care? No.  Actions: * If pain score is 4 or above: No action needed, pain <4.   1. Have you developed a fever since your procedure? No  2.   Have you had an respiratory symptoms (SOB or cough) since your procedure? No  3.   Have you tested positive for COVID 19 since your procedure No  4.   Have you had any family members/close contacts diagnosed with the COVID 19 since your procedure?  No   If yes to any of these questions please route to Joylene John, RN and Erenest Rasher, RN

## 2019-10-12 ENCOUNTER — Encounter: Payer: Self-pay | Admitting: Gastroenterology

## 2020-12-27 ENCOUNTER — Ambulatory Visit (INDEPENDENT_AMBULATORY_CARE_PROVIDER_SITE_OTHER): Payer: Medicare Other | Admitting: Family Medicine

## 2020-12-27 DIAGNOSIS — Z0000 Encounter for general adult medical examination without abnormal findings: Secondary | ICD-10-CM | POA: Diagnosis not present

## 2020-12-27 NOTE — Progress Notes (Signed)
MEDICARE ANNUAL WELLNESS VISIT  12/27/2020  Telephone Visit Disclaimer This Medicare AWV was conducted by telephone due to national recommendations for restrictions regarding the COVID-19 Pandemic (e.g. social distancing).  I verified, using two identifiers, that I am speaking with Anita Nguyen or their authorized healthcare agent. I discussed the limitations, risks, security, and privacy concerns of performing an evaluation and management service by telephone and the potential availability of an in-person appointment in the future. The patient expressed understanding and agreed to proceed.  Location of Patient: Home Location of Provider (nurse):  Provider Home  Subjective:    Anita Nguyen is a 70 y.o. female patient of Luetta Nutting, DO who had a Medicare Annual Wellness Visit today via telephone. Anita Nguyen is Retired and lives with their spouse. she has 1 child. she reports that she is socially active and does interact with friends/family regularly. she is minimally physically active and enjoys gardening.  Patient Care Team: Luetta Nutting, DO as PCP - General (Family Medicine)  Advanced Directives 12/27/2020  Does Patient Have a Medical Advance Directive? No  Would patient like information on creating a medical advance directive? No - Patient declined    Hospital Utilization Over the Past 12 Months: # of hospitalizations or ER visits: 0 # of surgeries: 0  Review of Systems    Patient reports that her overall health is unchanged compared to last year.  History obtained from chart review and the patient  Patient Reported Readings (BP, Pulse, CBG, Weight, etc) none  Pain Assessment Pain : No/denies pain     Current Medications & Allergies (verified) Allergies as of 12/27/2020       Reactions   Penicillins         Medication List        Accurate as of December 27, 2020 10:45 AM. If you have any questions, ask your nurse or doctor.          b complex vitamins  capsule Take 1 capsule by mouth daily.   CALCIUM ACETATE PO Take by mouth.   diclofenac 75 MG EC tablet Commonly known as: VOLTAREN Take 1 tablet (75 mg total) by mouth 2 (two) times daily.   levothyroxine 150 MCG tablet Commonly known as: SYNTHROID levothyroxine 150 mcg tablet   levothyroxine 150 MCG tablet Commonly known as: SYNTHROID Take 150 mcg by mouth daily.   nystatin cream Commonly known as: MYCOSTATIN APPLY TO AFFECTED AREA TWICE A DAY   QC Calcium-Magnesium-Zinc-D3 333.4-133 MG-UNIT Tabs Take by mouth.   VITAMIN D PO Take by mouth.        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   Tachycardia    Thyroid disease    Past Surgical History:  Procedure Laterality Date   BREAST LUMPECTOMY     BREAST REDUCTION SURGERY     CHOLECYSTECTOMY     COLONOSCOPY     11 yr ago- no polyps per pt    LASIK     TUBAL LIGATION     Family History  Problem Relation Age of Onset   Dementia Mother    Dementia Sister    Colon polyps Sister    Stomach cancer Father    Colon cancer Neg Hx    Esophageal cancer Neg Hx    Rectal cancer Neg Hx    Social History   Socioeconomic  History   Marital status: Married    Spouse name: Elta [place]   Number of children: 1   Years of education: 16   Highest education level: Bachelor's degree (e.g., BA, AB, BS)  Occupational History   Occupation: Water engineer: [employer]    Comment: retired  Tobacco Use   Smoking status: Never   Smokeless tobacco: Never  Vaping Use   Vaping Use: Never used  Substance and Sexual Activity   Alcohol use: No   Drug use: Never   Sexual activity: Yes    Partners: Male    Birth control/protection: Post-menopausal  Other Topics Concern   Not on file  Social History Narrative   Lives with her husband. She has one son. She enjoys gardening and yardwork.   Social  Determinants of Health   Financial Resource Strain: Low Risk    Difficulty of Paying Living Expenses: Not hard at all  Food Insecurity: No Food Insecurity   Worried About Charity fundraiser in the Last Year: Never true   [place] in the Last Year: Never true  Transportation Needs: No Transportation Needs   Lack of Transportation (Medical): No   Lack of Transportation (Non-Medical): No  Physical Activity: Inactive   Days of Exercise per Week: 0 days   Minutes of Exercise per Session: 0 min  Stress: No Stress Concern Present   Feeling of Stress : Not at all  Social Connections: Moderately Integrated   Frequency of Communication with Friends and Family: More than three times a week   Frequency of Social Gatherings with Friends and Family: Twice a week   Attends Religious Services: More than 4 times per year   Active Member of [employer] or Organizations: No   Attends Archivist Meetings: Never   Marital Status: Married    Activities of Daily Living In your present state of health, do you have any difficulty performing the following activities: 12/27/2020  Hearing? N  Vision? Y  Comment she is scheduled for cataract.  Difficulty concentrating or making decisions? N  Walking or climbing stairs? N  Dressing or bathing? N  Doing errands, shopping? N  Preparing Food and eating ? N  Using the Toilet? N  Managing your Medications? N  Managing your Finances? N  Housekeeping or managing your Housekeeping? N  Some recent data might be hidden    Patient Education/ Literacy How often do you need to have someone help you when you read instructions, pamphlets, or other written materials from your doctor or pharmacy?: 1 - Never What is the last grade level you completed in school?: Bachelor's Degree  Exercise Current Exercise Habits: Home exercise routine, Type of exercise: Other - see comments (yardwork), Time (Minutes): > 60, Frequency (Times/Week): >7, Weekly Exercise  (Minutes/Week): 0, Intensity: Mild, Exercise limited by: None identified  Diet Patient reports consuming 3 meals a day and 2-3 snack(s) a day Patient reports that her primary diet is: Regular Patient reports that she does have regular access to food.   Depression Screen PHQ 2/9 Scores 12/27/2020 08/23/2019  PHQ - 2 Score 0 0  PHQ- 9 Score - 0     Fall Risk Fall Risk  12/27/2020  Falls in the past year? 0  Number falls in past yr: 0  Injury with Fall? 0  Risk for fall due to : No Fall Risks  Follow up Falls evaluation completed     Objective:  Leinani Lisbon seemed alert and  oriented and she participated appropriately during our telephone visit.  Blood Pressure Weight BMI  BP Readings from Last 3 Encounters:  10/05/19 (!) 111/59  08/23/19 121/75  08/26/17 (!) 143/84   Wt Readings from Last 3 Encounters:  10/05/19 194 lb 9.6 oz (88.3 kg)  09/21/19 187 lb (84.8 kg)  08/23/19 193 lb 1.9 oz (87.6 kg)   BMI Readings from Last 1 Encounters:  10/05/19 29.59 kg/m    *Unable to obtain current vital signs, weight, and BMI due to telephone visit type  Hearing/Vision  Mi did not seem to have difficulty with hearing/understanding during the telephone conversation Reports that she has had a formal eye exam by an eye care professional within the past year Reports that she has not had a formal hearing evaluation within the past year *Unable to fully assess hearing and vision during telephone visit type  Cognitive Function: 6CIT Screen 12/27/2020  What Year? 0 points  What month? 0 points  What time? 0 points  Count back from 20 0 points  Months in reverse 0 points  Repeat phrase 0 points  Total Score 0   (Normal:0-7, Significant for Dysfunction: >8)  Normal Cognitive Function Screening: Yes   Immunization & Health Maintenance Record Immunization History  Administered Date(s) Administered   Influenza-Unspecified 05/15/2014, 04/14/2018, 03/16/2019   Moderna SARS-COV2 Booster  Vaccination 06/13/2020   Moderna Sars-Covid-2 Vaccination 08/16/2019, 09/14/2019    Health Maintenance  Topic Date Due   COVID-19 Vaccine (4 - Booster for Moderna series) 01/12/2021 (Originally 10/11/2020)   Zoster Vaccines- Shingrix (1 of 2) 03/29/2021 (Originally 02/17/2003)   TETANUS/TDAP  12/27/2021 (Originally 02/17/1972)   Hepatitis C Screening  12/27/2021 (Originally [birth year]17/1971)   PNA vac Low Risk Adult (1 of 2 - PCV13) 12/27/2021 (Originally 02/16/2018)   INFLUENZA VACCINE  02/12/2021   MAMMOGRAM  08/29/2021   COLONOSCOPY (Pts 45-30yrs Insurance coverage will need to be confirmed)  10/05/2026   DEXA SCAN  Completed   HPV VACCINES  Aged Out       Assessment  This is a routine wellness examination for [employer].  Health Maintenance: Due or Overdue There are no preventive care reminders to display for this patient.  Anita Nguyen does not need a referral for Community Assistance: Care Management:   no Social Work:    no Prescription Assistance:  no Nutrition/Diabetes Education:  no   Plan:  Personalized Goals  Goals Addressed               This Visit's Progress     Patient Stated (pt-stated)        12/27/2020 AWV Goal: Exercise for General Health  Patient will verbalize understanding of the benefits of increased physical activity: Exercising regularly is important. It will improve your overall fitness, flexibility, and endurance. Regular exercise also will improve your overall health. It can help you control your weight, reduce stress, and improve your bone density. Over the next year, patient will increase physical activity as tolerated with a goal of at least 150 minutes of moderate physical activity per week.  You can tell that you are exercising at a moderate intensity if your heart starts beating faster and you start breathing faster but can still hold a conversation. Moderate-intensity exercise ideas include: Walking 1 mile (1.6 km) in about 15  minutes Biking Hiking Golfing Dancing Water aerobics Patient will verbalize understanding of everyday activities that increase physical activity by providing examples like the following: Yard work, such as: Forensic psychologist  and bagging leaves Washing your car Pushing a stroller Shoveling snow Gardening Washing windows or floors Patient will be able to explain general safety guidelines for exercising:  Before you start a new exercise program, talk with your health care provider. Do not exercise so much that you hurt yourself, feel dizzy, or get very short of breath. Wear comfortable clothes and wear shoes with good support. Drink plenty of water while you exercise to prevent dehydration or heat stroke. Work out until your breathing and your heartbeat get faster.         Personalized Health Maintenance & Screening Recommendations  Pneumococcal vaccine  Td vaccine Screening mammography Shingles vaccine  Lung Cancer Screening Recommended: no (Low Dose CT Chest recommended if Age 68-80 years, 30 pack-year currently smoking OR have quit w/in past 15 years) Hepatitis C Screening recommended: no HIV Screening recommended: no  Advanced Directives: Written information was not prepared per patient's request.  Referrals & Orders No orders of the defined types were placed in this encounter.   Follow-up Plan Follow-up with Luetta Nutting, DO as planned Schedule your shingles vaccine and tetanus vaccine at your pharmacy. Pneumonia vaccine can be done at your next in-office visit or you can schedule a nurse visit.  Schedule your mammogram with your gynecologist.  Medicare wellness visit in one year.  AVS printed and mailed.    I have personally reviewed and noted the following in the patient's chart:   Medical and social history Use of alcohol, tobacco or illicit drugs  Current medications and supplements Functional ability and status Nutritional status Physical  activity Advanced directives List of other physicians Hospitalizations, surgeries, and ER visits in previous 12 months Vitals Screenings to include cognitive, depression, and falls Referrals and appointments  In addition, I have reviewed and discussed with Anita Nguyen certain preventive protocols, quality metrics, and best practice recommendations. A written personalized care plan for preventive services as well as general preventive health recommendations is available and can be mailed to the patient at her request.      Tinnie Gens, RN  12/27/2020

## 2020-12-27 NOTE — Patient Instructions (Signed)
[facility] Maintenance Summary and Written Plan of Care  Ms. Clair Gulling ,  Thank you for allowing me to perform your Medicare Annual Wellness Visit and for your ongoing commitment to your health.   Health Maintenance & Immunization History Health Maintenance  Topic Date Due   COVID-19 Vaccine (4 - Booster for Moderna series) 01/12/2021 (Originally 10/11/2020)   Zoster Vaccines- Shingrix (1 of 2) 03/29/2021 (Originally 02/17/2003)   TETANUS/TDAP  12/27/2021 (Originally 02/17/1972)   Hepatitis C Screening  12/27/2021 (Originally 02/17/1971)   PNA vac Low Risk Adult (1 of 2 - PCV13) 12/27/2021 (Originally 02/16/2018)   INFLUENZA VACCINE  02/12/2021   MAMMOGRAM  08/29/2021   COLONOSCOPY (Pts 45-27yrs Insurance coverage will need to be confirmed)  10/05/2026   DEXA SCAN  Completed   HPV VACCINES  Aged Out   Immunization History  Administered Date(s) Administered   Influenza-Unspecified 05/15/2014, 04/14/2018, 03/16/2019   Moderna SARS-COV2 Booster Vaccination 06/13/2020   Moderna Sars-Covid-2 Vaccination 08/16/2019, 09/14/2019    These are the patient goals that we discussed:  Goals Addressed               This Visit's Progress     Patient Stated (pt-stated)        12/27/2020 AWV Goal: Exercise for General Health  Patient will verbalize understanding of the benefits of increased physical activity: Exercising regularly is important. It will improve your overall fitness, flexibility, and endurance. Regular exercise also will improve your overall health. It can help you control your weight, reduce stress, and improve your bone density. Over the next year, patient will increase physical activity as tolerated with a goal of at least 150 minutes of moderate physical activity per week.  You can tell that you are exercising at a moderate intensity if your heart starts beating faster and you start breathing faster but can still hold a conversation. Moderate-intensity  exercise ideas include: Walking 1 mile (1.6 km) in about 15 minutes Biking Hiking Golfing Dancing Water aerobics Patient will verbalize understanding of everyday activities that increase physical activity by providing examples like the following: Yard work, such as: Sales promotion account executive Gardening Washing windows or floors Patient will be able to explain general safety guidelines for exercising:  Before you start a new exercise program, talk with your health care provider. Do not exercise so much that you hurt yourself, feel dizzy, or get very short of breath. Wear comfortable clothes and wear shoes with good support. Drink plenty of water while you exercise to prevent dehydration or heat stroke. Work out until your breathing and your heartbeat get faster.           This is a list of Health Maintenance Items that are overdue or due now: Pneumococcal vaccine  Td vaccine Screening mammography Shingles vaccine  Orders/Referrals Placed Today: No orders of the defined types were placed in this encounter.  (Contact our referral department at [phone] if you have not spoken with someone about your referral appointment within the next 5 days)    Follow-up Plan Follow-up with Luetta Nutting, DO as planned Schedule your shingles vaccine and tetanus vaccine at your pharmacy. Pneumonia vaccine can be done at your next in-office visit or you can schedule a nurse visit.  Schedule your mammogram with your gynecologist.  Medicare wellness visit in one year.  AVS printed and mailed.      Thank you for enrolling in [facility].  Please follow the instructions below to securely access your online medical record. MyChart allows you to send messages to your doctor, view your test results, manage appointments, and more.   How Do I Sign Up? In your Internet browser, go to [employer] and enter  [email or web address]. Click on the Sign Up Now link in the Sign In box. You will see the New Member Sign Up page. Enter your MyChart Access Code exactly as it appears below. You will not need to use this code after you've completed the sign-up process. If you do not sign up before the expiration date, you must request a new code.  MyChart Access Code: [email or web address] Expires: 02/10/2021 10:54 AM  Enter your Social Security Number ([id number]) and Date of Birth (mm/dd/yyyy) as indicated and click Submit. You will be taken to the next sign-up page. Create a MyChart ID. This will be your MyChart login ID and cannot be changed, so think of one that is secure and easy to remember. Create a Scientist, research (physical sciences). You can change your password at any time. Enter your Password Reset Question and Answer. This can be used at a later time if you forget your password.  Enter your e-mail address. You will receive e-mail notification when new information is available in [facility]. Click Sign Up. You can now view your medical record.   Additional Information Remember, MyChart is NOT to be used for urgent needs. For medical emergencies, dial 911.

## 2021-08-21 ENCOUNTER — Ambulatory Visit (INDEPENDENT_AMBULATORY_CARE_PROVIDER_SITE_OTHER): Payer: Medicare Other | Admitting: Family Medicine

## 2021-08-21 ENCOUNTER — Encounter: Payer: Self-pay | Admitting: Family Medicine

## 2021-08-21 ENCOUNTER — Other Ambulatory Visit: Payer: Self-pay

## 2021-08-21 VITALS — BP 119/74 | HR 80 | Ht 68.0 in | Wt 200.0 lb

## 2021-08-21 DIAGNOSIS — Z13220 Encounter for screening for lipoid disorders: Secondary | ICD-10-CM | POA: Diagnosis not present

## 2021-08-21 DIAGNOSIS — Z0000 Encounter for general adult medical examination without abnormal findings: Secondary | ICD-10-CM

## 2021-08-21 DIAGNOSIS — R739 Hyperglycemia, unspecified: Secondary | ICD-10-CM | POA: Diagnosis not present

## 2021-08-21 DIAGNOSIS — E039 Hypothyroidism, unspecified: Secondary | ICD-10-CM

## 2021-08-21 DIAGNOSIS — R21 Rash and other nonspecific skin eruption: Secondary | ICD-10-CM | POA: Diagnosis not present

## 2021-08-21 NOTE — Assessment & Plan Note (Signed)
Updating TSH today. 

## 2021-08-21 NOTE — Progress Notes (Signed)
Anita Nguyen - 71 y.o. female MRN [id number]  Date of birth: [birth year]  Subjective No chief complaint on file.   HPI Anita Nguyen is a 71 year old female here today for follow-up visit.  Has history of hypothyroidism.  Her GYN has been prescribing her levothyroxine.  She is overdue for updated thyroid labs.  She does have recurrent rash on her face that she describes as, red, dry and scaly skin around her eyebrows, eyelids and cheeks.  She does have some involvement at times on her scalp.  She would like a referral to dermatology.  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   Tachycardia    Thyroid disease     Past Surgical History:  Procedure Laterality Date   BREAST LUMPECTOMY     BREAST REDUCTION SURGERY     CHOLECYSTECTOMY     COLONOSCOPY     11 yr ago- no polyps per pt    LASIK     TUBAL LIGATION      Social History   Socioeconomic History   Marital status: Married    Spouse name: Elta Guadeloupe   Number of children: 1   Years of education: 16   Highest education level: Bachelor's degree (e.g., BA, AB, BS)  Occupational History   Occupation: Water engineer: [employer]    Comment: retired  Tobacco Use   Smoking status: Never   Smokeless tobacco: Never  Vaping Use   Vaping Use: Never used  Substance and Sexual Activity   Alcohol use: No   Drug use: Never   Sexual activity: Yes    Partners: Male    Birth control/protection: Post-menopausal  Other Topics Concern   Not on file  Social History Narrative   Lives with her husband. She has one son. She enjoys gardening and yardwork.   Social Determinants of Health   Financial Resource Strain: Low Risk    Difficulty of Paying Living Expenses: Not hard at all  Food Insecurity: No Food  Insecurity   Worried About Charity fundraiser in the Last Year: Never true   [place] in the Last Year: Never true  Transportation Needs: No Transportation Needs   Lack of Transportation (Medical): No   Lack of Transportation (Non-Medical): No  Physical Activity: Inactive   Days of Exercise per Week: 0 days   Minutes of Exercise per Session: 0 min  Stress: No Stress Concern Present   Feeling of Stress : Not at all  Social Connections: Moderately Integrated   Frequency of Communication with Friends and Family: More than three times a week   Frequency of Social Gatherings with Friends and Family: Twice a week   Attends Religious Services: More than 4 times per year   Active Member of [employer] or Organizations: No   Attends Music therapist: Never   Marital Status: Married    Family History  Problem Relation Age of Onset   Dementia Mother    Dementia Sister    Colon polyps Sister    Stomach cancer Father    Colon cancer Neg Hx    Esophageal cancer Neg Hx    Rectal cancer Neg Hx     Health Maintenance  Topic Date Due   TETANUS/TDAP  12/27/2021 (Originally 02/17/1972)   Hepatitis C Screening  12/27/2021 (Originally 02/17/1971)   Pneumonia Vaccine 59+ Years old (1 - PCV) 08/21/2022 (Originally 02/16/2018)   Zoster Vaccines- Shingrix (1 of 2) 08/21/2022 (Originally 02/17/2003)   COVID-19 Vaccine (3 - Booster for Moderna series) 09/06/2022 (Originally 08/08/2020)   MAMMOGRAM  08/29/2021   COLONOSCOPY (Pts 45-34yrs Insurance coverage will need to be confirmed)  10/05/2026   INFLUENZA VACCINE  Completed   DEXA SCAN  Completed   HPV VACCINES  Aged Out     ----------------------------------------------------------------------------------------------------------------------------------------------------------------------------------------------------------------- Physical Exam BP 119/74 (BP Location: Left Arm, Patient Position: Sitting, Cuff Size: Large)    Pulse 80    Ht  5\' 8"  (1.727 m)    Wt 200 lb (90.7 kg)    SpO2 99%    BMI 30.41 kg/m   Physical Exam Constitutional:      Appearance: Normal appearance.  Eyes:     General: No scleral icterus. Cardiovascular:     Rate and Rhythm: Normal rate and regular rhythm.  Pulmonary:     Effort: Pulmonary effort is normal.     Breath sounds: Normal breath sounds.  Musculoskeletal:     Cervical back: Neck supple.  Skin:    Comments: Erythematous papules with dry, flaking skin along the upper eyebrows and eyelids as well as upper cheeks.  Neurological:     Mental Status: She is alert.  Psychiatric:        Mood and Affect: Mood normal.        Behavior: Behavior normal.    ------------------------------------------------------------------------------------------------------------------------------------------------------------------------------------------------------------------- Assessment and Plan  Rash Eczema versus seborrheic dermatitis.  Would favor seborrheic dermatitis given involvement of scalp as well.  Recommend trial of selenium sulfide-containing shampoo or Nizoral.  Referral also placed to dermatology.  Acquired hypothyroidism Updating TSH today.  Labs drawn in anticipation of upcoming annual exam.   No orders of the defined types were placed in this encounter.   No follow-ups on file.    This visit occurred during the SARS-CoV-2 public health emergency.  Safety protocols were in place, including screening questions prior to the visit, additional usage of staff PPE, and extensive cleaning of exam room while observing appropriate contact time as indicated for disinfecting solutions.

## 2021-08-21 NOTE — Patient Instructions (Signed)
Have shingles shot completed at your local pharmacy.  Try selsun blue or Nizoral shampoo on the face and scalp.   Seborrheic Dermatitis, Adult Seborrheic dermatitis is a skin disease that causes red, scaly patches. It usually occurs on the scalp, and it is often called dandruff. The patches may appear on other parts of the body. Skin patches tend to appear where there are many oil glands in the skin. Areas of the body that are commonly affected include the: Scalp. Ears. Eyebrows. Face. Bearded area of [employer]. Skin folds of the body, such as the armpits, groin, and buttocks. Chest. The condition may come and go for no known reason, and it is often long-lasting (chronic). What are the causes? The cause of this condition is not known. What increases the risk? The following factors may make you more likely to develop this condition: Having certain conditions, such as: HIV (human immunodeficiency virus). AIDS (acquired immunodeficiency syndrome). Parkinson's disease. Mood disorders, such as depression. Being 32-63 years old. What are the signs or symptoms? Symptoms of this condition include: Thick scales on the scalp. Redness on the face or in the armpits. Skin that is flaky. The flakes may be white or yellow. Skin that seems oily or dry but is not helped with moisturizers. Itching or burning in the affected areas. How is this diagnosed? This condition is diagnosed with a medical history and physical exam. A sample of your skin may be tested (skin biopsy). You may need to see a skin specialist (dermatologist). How is this treated? There is no cure for this condition, but treatment can help to manage the symptoms. You may get treatment to remove scales, lower the risk of skin infection, and reduce swelling or itching. Treatment may include: Creams that reduce skin yeast. Medicated shampoo. Moisturizing creams or ointments. Creams that reduce swelling and irritation  (steroids). Follow these instructions at home: Apply over-the-counter and prescription medicines only as told by your health care provider. Use any medicated shampoo, skin creams, or ointments only as told by your health care provider. Keep all follow-up visits as told by your health care provider. This is important. Contact a health care provider if: Your symptoms do not improve with treatment. Your symptoms get worse. You have new symptoms. Get help right away if: Your condition rapidly worsens with treatment. Summary Seborrheic dermatitis is a skin disease that causes red, scaly patches. Seborrheic dermatitis commonly affects the scalp, face, and skin folds. There is no cure for this condition, but treatment can help to manage the symptoms. This information is not intended to replace advice given to you by your health care provider. Make sure you discuss any questions you have with your health care provider. Document Revised: 04/08/2019 Document Reviewed: 04/08/2019 Elsevier Patient Education  [place].

## 2021-08-21 NOTE — Assessment & Plan Note (Signed)
Eczema versus seborrheic dermatitis.  Would favor seborrheic dermatitis given involvement of scalp as well.  Recommend trial of selenium sulfide-containing shampoo or Nizoral.  Referral also placed to dermatology.

## 2021-08-22 LAB — CBC WITH DIFFERENTIAL/PLATELET
Absolute Monocytes: 556 cells/uL (ref 200–950)
Basophils Absolute: 31 cells/uL (ref 0–200)
Basophils Relative: 0.3 %
Eosinophils Absolute: 93 cells/uL (ref 15–500)
Eosinophils Relative: 0.9 %
HCT: 42.9 % (ref 35.0–45.0)
Hemoglobin: 14.7 g/dL (ref 11.7–15.5)
Lymphs Abs: 1658 cells/uL (ref 850–3900)
MCH: 31.8 pg (ref 27.0–33.0)
MCHC: 34.3 g/dL (ref 32.0–36.0)
MCV: 92.9 fL (ref 80.0–100.0)
MPV: 10.3 fL (ref 7.5–12.5)
Monocytes Relative: 5.4 %
Neutro Abs: 7962 cells/uL — ABNORMAL HIGH (ref 1500–7800)
Neutrophils Relative %: 77.3 %
Platelets: 279 10*3/uL (ref 140–400)
RBC: 4.62 10*6/uL (ref 3.80–5.10)
RDW: 13.1 % (ref 11.0–15.0)
Total Lymphocyte: 16.1 %
WBC: 10.3 10*3/uL (ref 3.8–10.8)

## 2021-08-22 LAB — COMPLETE METABOLIC PANEL WITH GFR
AG Ratio: 1.6 (calc) (ref 1.0–2.5)
ALT: 19 U/L (ref 6–29)
AST: 17 U/L (ref 10–35)
Albumin: 4.3 g/dL (ref 3.6–5.1)
Alkaline phosphatase (APISO): 73 U/L (ref 37–153)
BUN: 13 mg/dL (ref 7–25)
CO2: 28 mmol/L (ref 20–32)
Calcium: 10.1 mg/dL (ref 8.6–10.4)
Chloride: 104 mmol/L (ref 98–110)
Creat: 0.8 mg/dL (ref 0.50–1.05)
Globulin: 2.7 g/dL (calc) (ref 1.9–3.7)
Glucose, Bld: 92 mg/dL (ref 65–99)
Potassium: 4.3 mmol/L (ref 3.5–5.3)
Sodium: 140 mmol/L (ref 135–146)
Total Bilirubin: 0.5 mg/dL (ref 0.2–1.2)
Total Protein: 7 g/dL (ref 6.1–8.1)
eGFR: 80 mL/min/{1.73_m2} (ref >=60)

## 2021-08-22 LAB — LIPID PANEL W/REFLEX DIRECT LDL
Cholesterol: 172 mg/dL (ref <200)
HDL: 48 mg/dL — ABNORMAL LOW (ref >=50)
LDL Cholesterol (Calc): 102 mg/dL (calc) — ABNORMAL HIGH
Non-HDL Cholesterol (Calc): 124 mg/dL (calc) (ref <130)
Total CHOL/HDL Ratio: 3.6 (calc) (ref <5.0)
Triglycerides: 127 mg/dL (ref <150)

## 2021-08-22 LAB — TSH: TSH: 2.04 mIU/L (ref 0.40–4.50)

## 2021-08-22 LAB — HEMOGLOBIN A1C
Hgb A1c MFr Bld: 5.8 % of total Hgb — ABNORMAL HIGH (ref <5.7)
Mean Plasma Glucose: 120 mg/dL
eAG (mmol/L): 6.6 mmol/L

## 2021-10-31 ENCOUNTER — Ambulatory Visit (INDEPENDENT_AMBULATORY_CARE_PROVIDER_SITE_OTHER): Payer: Medicare Other | Admitting: Family Medicine

## 2021-10-31 ENCOUNTER — Encounter: Payer: Self-pay | Admitting: Family Medicine

## 2021-10-31 VITALS — BP 116/71 | HR 80 | Ht 68.0 in | Wt 201.8 lb

## 2021-10-31 DIAGNOSIS — Z23 Encounter for immunization: Secondary | ICD-10-CM | POA: Diagnosis not present

## 2021-10-31 DIAGNOSIS — Z1231 Encounter for screening mammogram for malignant neoplasm of breast: Secondary | ICD-10-CM

## 2021-10-31 DIAGNOSIS — Z0000 Encounter for general adult medical examination without abnormal findings: Secondary | ICD-10-CM

## 2021-10-31 NOTE — Patient Instructions (Signed)
Preventive Care 7 Years and Older, Female ?Preventive care refers to lifestyle choices and visits with your health care provider that can promote health and wellness. Preventive care visits are also called wellness exams. ?What can I expect for my preventive care visit? ?Counseling ?Your health care provider may ask you questions about your: ?Medical history, including: ?Past medical problems. ?Family medical history. ?Pregnancy and menstrual history. ?History of falls. ?Current health, including: ?Memory and ability to understand (cognition). ?Emotional well-being. ?Home life and relationship well-being. ?Sexual activity and sexual health. ?Lifestyle, including: ?Alcohol, nicotine or tobacco, and drug use. ?Access to firearms. ?Diet, exercise, and sleep habits. ?Work and work Statistician. ?Sunscreen use. ?Safety issues such as seatbelt and bike helmet use. ?Physical exam ?Your health care provider will check your: ?Height and weight. These may be used to calculate your BMI (body mass index). BMI is a measurement that tells if you are at a healthy weight. ?Waist circumference. This measures the distance around your waistline. This measurement also tells if you are at a healthy weight and may help predict your risk of certain diseases, such as type 2 diabetes and high blood pressure. ?Heart rate and blood pressure. ?Body temperature. ?Skin for abnormal spots. ?What immunizations do I need? ? ?Vaccines are usually given at various ages, according to a schedule. Your health care provider will recommend vaccines for you based on your age, medical history, and lifestyle or other factors, such as travel or where you work. ?What tests do I need? ?Screening ?Your health care provider may recommend screening tests for certain conditions. This may include: ?Lipid and cholesterol levels. ?Hepatitis C test. ?Hepatitis B test. ?HIV (human immunodeficiency virus) test. ?STI (sexually transmitted infection) testing, if you are at  risk. ?Lung cancer screening. ?Colorectal cancer screening. ?Diabetes screening. This is done by checking your blood sugar (glucose) after you have not eaten for a while (fasting). ?Mammogram. Talk with your health care provider about how often you should have regular mammograms. ?BRCA-related cancer screening. This may be done if you have a family history of breast, ovarian, tubal, or peritoneal cancers. ?Bone density scan. This is done to screen for osteoporosis. ?Talk with your health care provider about your test results, treatment options, and if necessary, the need for more tests. ?Follow these instructions at home: ?Eating and drinking ? ?Eat a diet that includes fresh fruits and vegetables, whole grains, lean protein, and low-fat dairy products. Limit your intake of foods with high amounts of sugar, saturated fats, and salt. ?Take vitamin and mineral supplements as recommended by your health care provider. ?Do not drink alcohol if your health care provider tells you not to drink. ?If you drink alcohol: ?Limit how much you have to 0-1 drink a day. ?Know how much alcohol is in your drink. In the [place] one drink equals one 12 oz bottle of beer (355 mL), one 5 oz glass of wine (148 mL), or one 1? oz glass of hard liquor (44 mL). ?Lifestyle ?Brush your teeth every morning and night with fluoride toothpaste. Floss one time each day. ?Exercise for at least 30 minutes 5 or more days each week. ?Do not use any products that contain nicotine or tobacco. These products include cigarettes, chewing tobacco, and vaping devices, such as e-cigarettes. If you need help quitting, ask your health care provider. ?Do not use drugs. ?If you are sexually active, practice safe sex. Use a condom or other form of protection in order to prevent STIs. ?Take aspirin only as told by  your health care provider. Make sure that you understand how much to take and what form to take. Work with your health care provider to find out whether it  is safe and beneficial for you to take aspirin daily. ?Ask your health care provider if you need to take a cholesterol-lowering medicine (statin). ?Find healthy ways to manage stress, such as: ?Meditation, yoga, or listening to music. ?Journaling. ?Talking to a trusted person. ?Spending time with friends and family. ?Minimize exposure to UV radiation to reduce your risk of skin cancer. ?Safety ?Always wear your seat belt while driving or riding in a vehicle. ?Do not drive: ?If you have been drinking alcohol. Do not ride with someone who has been drinking. ?When you are tired or distracted. ?While texting. ?If you have been using any mind-altering substances or drugs. ?Wear a helmet and other protective equipment during sports activities. ?If you have firearms in your house, make sure you follow all gun safety procedures. ?What's next? ?Visit your health care provider once a year for an annual wellness visit. ?Ask your health care provider how often you should have your eyes and teeth checked. ?Stay up to date on all vaccines. ?This information is not intended to replace advice given to you by your health care provider. Make sure you discuss any questions you have with your health care provider. ?Document Revised: 12/27/2020 Document Reviewed: 12/27/2020 ?Elsevier Patient Education ? [place]. ? ?

## 2021-10-31 NOTE — Assessment & Plan Note (Signed)
Well adult ?Recent labs reviewed with her.  Mild elevation in glucose/a1c and lipids.  She is working on dietary changes.  ?Screenings:  Mammogram ordered.  ?Immunizations: Prevnar 20 given. She will plan to get Shingrix at her pharmacy.  ?Anticipatory guidance/Risk factor reduction:  Recommendations per AVS.   ?

## 2021-10-31 NOTE — Progress Notes (Signed)
?Anita Nguyen - 71 y.o. female MRN [id number]  Date of birth: 05-26-1951 ? ?Subjective ?Chief Complaint  ?Patient presents with  ? Annual Exam  ? ?HPI ?Anita Nguyen is a 71 y.o. female here today for annual exam.  Overall she is doing well.   ? ?Had a few episodes of palpitations last month.  Has history of tachycardia however this felt a little different.  No chest pain or dyspnea.  She has cut back on her chocolate intake and this seems to have helped.  She denies other caffeine intake.  ? ?She is due for mammogram, would like to have at the [facility].  ? ?Up to date on colon cancer screening.  ? ?She is due for shingrix, she plans to get this at her pharmacy.  She agrees to [employer] 20 today.  ? ?Review of Systems  ?Constitutional:  Negative for chills, fever, malaise/fatigue and weight loss.  ?HENT:  Negative for congestion, ear pain and sore throat.   ?Eyes:  Negative for blurred vision, double vision and pain.  ?Respiratory:  Negative for cough and shortness of breath.   ?Cardiovascular:  Negative for chest pain and palpitations.  ?Gastrointestinal:  Negative for abdominal pain, blood in stool, constipation, heartburn and nausea.  ?Genitourinary:  Negative for dysuria and urgency.  ?Musculoskeletal:  Negative for joint pain and myalgias.  ?Neurological:  Negative for dizziness and headaches.  ?Endo/Heme/Allergies:  Does not bruise/bleed easily.  ?Psychiatric/Behavioral:  Negative for depression. The patient is not nervous/anxious and does not have insomnia.   ? ?Allergies  ?Allergen Reactions  ? Penicillins   ? ? ?Past Medical History:  ?Diagnosis Date  ? Allergy   ? Cataract   ? forming   ? DDD (degenerative disc disease)   ? Polymyalgia rheumatica ([place]) 2012  ? Tachycardia   ? Thyroid disease   ? ? ?Past Surgical History:  ?Procedure Laterality Date  ? BREAST LUMPECTOMY    ? BREAST REDUCTION SURGERY    ? CHOLECYSTECTOMY    ? COLONOSCOPY    ? 11 yr ago- no polyps per pt   ? LASIK    ? TUBAL LIGATION     ? ? ?Social History  ? ?Socioeconomic History  ? Marital status: Married  ?  Spouse name: Elta Guadeloupe  ? Number of children: 1  ? Years of education: 46  ? Highest education level: Bachelor's degree (e.g., BA, AB, BS)  ?Occupational History  ? Occupation: Optometrist   ?  Employer: [employer]  ?  Comment: retired  ?Tobacco Use  ? Smoking status: Never  ? Smokeless tobacco: Never  ?Vaping Use  ? Vaping Use: Never used  ?Substance and Sexual Activity  ? Alcohol use: No  ? Drug use: Never  ? Sexual activity: Yes  ?  Partners: Male  ?  Birth control/protection: Post-menopausal  ?Other Topics Concern  ? Not on file  ?Social History Narrative  ? Lives with her husband. She has one son. She enjoys gardening and yardwork.  ? ?Social Determinants of Health  ? ?Financial Resource Strain: Low Risk   ? Difficulty of Paying Living Expenses: Not hard at all  ?Food Insecurity: No Food Insecurity  ? Worried About Charity fundraiser in the Last Year: Never true  ? Ran Out of Food in the Last Year: Never true  ?Transportation Needs: No Transportation Needs  ? Lack of Transportation (Medical): No  ? Lack of Transportation (Non-Medical): No  ?Physical Activity: Inactive  ? Days of Exercise per  Week: 0 days  ? Minutes of Exercise per Session: 0 min  ?Stress: No Stress Concern Present  ? Feeling of Stress : Not at all  ?Social Connections: Moderately Integrated  ? Frequency of Communication with Friends and Family: More than three times a week  ? Frequency of Social Gatherings with Friends and Family: Twice a week  ? Attends Religious Services: More than 4 times per year  ? Active Member of Clubs or Organizations: No  ? Attends Archivist Meetings: Never  ? Marital Status: Married  ? ? ?Family History  ?Problem Relation Age of Onset  ? Dementia Mother   ? Dementia Sister   ? Colon polyps Sister   ? Stomach cancer Father   ? Colon cancer Neg Hx   ? Esophageal cancer Neg Hx   ? Rectal cancer Neg Hx   ? ? ?Health Maintenance  ?Topic Date Due   ? TETANUS/TDAP  12/27/2021 (Originally 02/17/1972)  ? Hepatitis C Screening  12/27/2021 (Originally 02/17/1971)  ? Pneumonia Vaccine 61+ Years old (1 - PCV) 08/21/2022 (Originally 02/16/2018)  ? Zoster Vaccines- Shingrix (1 of 2) 08/21/2022 (Originally 02/17/2003)  ? COVID-19 Vaccine (3 - Booster for Moderna series) 09/06/2022 (Originally 08/08/2020)  ? MAMMOGRAM  11/01/2022 (Originally 08/29/2021)  ? INFLUENZA VACCINE  02/12/2022  ? COLONOSCOPY (Pts 45-34yr Insurance coverage will need to be confirmed)  10/05/2026  ? DEXA SCAN  Completed  ? HPV VACCINES  Aged Out  ? ? ? ?----------------------------------------------------------------------------------------------------------------------------------------------------------------------------------------------------------------- ?Physical Exam ?BP 116/71 (BP Location: Left Arm, Patient Position: Sitting, Cuff Size: Large)   Pulse 80   Ht '5\' 8"'$  (1.727 m)   Wt 201 lb 12.8 oz (91.5 kg)   SpO2 95%   BMI 30.68 kg/m?  ? ?Physical Exam ?Constitutional:   ?   General: She is not in acute distress. ?HENT:  ?   Head: Normocephalic and atraumatic.  ?   Right Ear: Tympanic membrane and ear canal normal.  ?   Left Ear: Tympanic membrane and ear canal normal.  ?   Nose: Nose normal.  ?Eyes:  ?   General: No scleral icterus. ?   Conjunctiva/sclera: Conjunctivae normal.  ?Neck:  ?   Thyroid: No thyromegaly.  ?Cardiovascular:  ?   Rate and Rhythm: Normal rate and regular rhythm.  ?   Heart sounds: Normal heart sounds.  ?Pulmonary:  ?   Effort: Pulmonary effort is normal.  ?   Breath sounds: Normal breath sounds.  ?Abdominal:  ?   General: Bowel sounds are normal. There is no distension.  ?   Palpations: Abdomen is soft.  ?   Tenderness: There is no abdominal tenderness. There is no guarding.  ?Musculoskeletal:     ?   General: Normal range of motion.  ?   Cervical back: Normal range of motion and neck supple.  ?Lymphadenopathy:  ?   Cervical: No cervical adenopathy.  ?Skin: ?   General:  Skin is warm and dry.  ?   Findings: No rash.  ?Neurological:  ?   General: No focal deficit present.  ?   Mental Status: She is alert and oriented to person, place, and time.  ?   Cranial Nerves: No cranial nerve deficit.  ?   Coordination: Coordination normal.  ?Psychiatric:     ?   Mood and Affect: Mood normal.     ?   Behavior: Behavior normal.  ? ? ?------------------------------------------------------------------------------------------------------------------------------------------------------------------------------------------------------------------- ?Assessment and Plan ? ?Well adult exam ?Well adult ?Recent labs reviewed  with her.  Mild elevation in glucose/a1c and lipids.  She is working on dietary changes.  ?Screenings:  Mammogram ordered.  ?Immunizations: Prevnar 20 given. She will plan to get Shingrix at her pharmacy.  ?Anticipatory guidance/Risk factor reduction:  Recommendations per AVS.   ? ? ?No orders of the defined types were placed in this encounter. ? ? ?No follow-ups on file. ? ? ? ?This visit occurred during the SARS-CoV-2 public health emergency.  Safety protocols were in place, including screening questions prior to the visit, additional usage of staff PPE, and extensive cleaning of exam room while observing appropriate contact time as indicated for disinfecting solutions.  ? ?

## 2021-11-16 ENCOUNTER — Ambulatory Visit (HOSPITAL_BASED_OUTPATIENT_CLINIC_OR_DEPARTMENT_OTHER): Admission: RE | Admit: 2021-11-16 | Payer: Medicare Other | Source: Ambulatory Visit

## 2021-11-16 ENCOUNTER — Encounter: Payer: Self-pay | Admitting: Family Medicine

## 2021-11-16 ENCOUNTER — Ambulatory Visit (HOSPITAL_BASED_OUTPATIENT_CLINIC_OR_DEPARTMENT_OTHER)
Admission: RE | Admit: 2021-11-16 | Discharge: 2021-11-16 | Disposition: A | Discharge status: Home / Self Care | Payer: Medicare Other | Source: Ambulatory Visit | Attending: Family Medicine | Admitting: Family Medicine

## 2021-11-16 ENCOUNTER — Ambulatory Visit (INDEPENDENT_AMBULATORY_CARE_PROVIDER_SITE_OTHER): Payer: Medicare Other | Admitting: Family Medicine

## 2021-11-16 VITALS — BP 122/80 | HR 75 | Wt 204.0 lb

## 2021-11-16 DIAGNOSIS — R1011 Right upper quadrant pain: Secondary | ICD-10-CM | POA: Insufficient documentation

## 2021-11-16 DIAGNOSIS — R1033 Periumbilical pain: Secondary | ICD-10-CM

## 2021-11-16 DIAGNOSIS — K858 Other acute pancreatitis without necrosis or infection: Secondary | ICD-10-CM | POA: Insufficient documentation

## 2021-11-16 LAB — CBC WITH DIFFERENTIAL/PLATELET
Absolute Monocytes: 576 cells/uL (ref 200–950)
Basophils Absolute: 24 cells/uL (ref 0–200)
Basophils Relative: 0.3 %
Eosinophils Absolute: 80 cells/uL (ref 15–500)
Eosinophils Relative: 1 %
HCT: 41.6 % (ref 35.0–45.0)
Hemoglobin: 13.9 g/dL (ref 11.7–15.5)
Lymphs Abs: 2192 cells/uL (ref 850–3900)
MCH: 30.9 pg (ref 27.0–33.0)
MCHC: 33.4 g/dL (ref 32.0–36.0)
MCV: 92.4 fL (ref 80.0–100.0)
MPV: 10 fL (ref 7.5–12.5)
Monocytes Relative: 7.2 %
Neutro Abs: 5128 cells/uL (ref 1500–7800)
Neutrophils Relative %: 64.1 %
Platelets: 259 10*3/uL (ref 140–400)
RBC: 4.5 10*6/uL (ref 3.80–5.10)
RDW: 12.9 % (ref 11.0–15.0)
Total Lymphocyte: 27.4 %
WBC: 8 10*3/uL (ref 3.8–10.8)

## 2021-11-16 LAB — COMPLETE METABOLIC PANEL WITH GFR
AG Ratio: 1.5 (calc) (ref 1.0–2.5)
ALT: 25 U/L (ref 6–29)
AST: 15 U/L (ref 10–35)
Albumin: 4.3 g/dL (ref 3.6–5.1)
Alkaline phosphatase (APISO): 63 U/L (ref 37–153)
BUN: 14 mg/dL (ref 7–25)
CO2: 28 mmol/L (ref 20–32)
Calcium: 9.7 mg/dL (ref 8.6–10.4)
Chloride: 104 mmol/L (ref 98–110)
Creat: 0.78 mg/dL (ref 0.50–1.05)
Globulin: 2.8 g/dL (calc) (ref 1.9–3.7)
Glucose, Bld: 113 mg/dL — ABNORMAL HIGH (ref 65–99)
Potassium: 4.2 mmol/L (ref 3.5–5.3)
Sodium: 138 mmol/L (ref 135–146)
Total Bilirubin: 0.4 mg/dL (ref 0.2–1.2)
Total Protein: 7.1 g/dL (ref 6.1–8.1)
eGFR: 83 mL/min/{1.73_m2} (ref >=60)

## 2021-11-16 LAB — LIPASE: Lipase: 24 U/L (ref 7–60)

## 2021-11-16 MED ORDER — PREDNISONE 50 MG PO TABS: ORAL_TABLET | ORAL | 0 refills | Status: AC

## 2021-11-16 MED ORDER — IOHEXOL 300 MG/ML  SOLN
100.0000 mL | Freq: Once | INTRAMUSCULAR | Status: AC | PRN
  Administered 2021-11-16: 100 mL via INTRAVENOUS

## 2021-11-16 MED ORDER — TRAMADOL HCL 50 MG PO TABS: 50.0000 mg | ORAL_TABLET | Freq: Three times a day (TID) | ORAL | 0 refills | Status: DC | PRN

## 2021-11-16 MED ORDER — TRAMADOL HCL 50 MG PO TABS: 50.0000 mg | ORAL_TABLET | Freq: Three times a day (TID) | ORAL | 0 refills | Status: AC | PRN

## 2021-11-16 MED ORDER — VALACYCLOVIR HCL 1 G PO TABS: 1000.0000 mg | ORAL_TABLET | Freq: Three times a day (TID) | ORAL | 0 refills | Status: AC

## 2021-11-16 NOTE — Assessment & Plan Note (Signed)
RUQ and periumbilical pain concerning for acute pancreatitis. She has had cholecystectomy.  Stat labs and stat CT abdomen ordered today for further evaluation.  I did think of shingles in her ddx as the pain is dermatomal in distribution as well.  She has no rash as this time but I asked her to monitor for this.   ?

## 2021-11-16 NOTE — Patient Instructions (Signed)
We'll be in touch with labs and CT results.  ?

## 2021-11-16 NOTE — Progress Notes (Signed)
?Anita Nguyen - 71 y.o. female MRN [id number]  Date of birth: 07/29/1950 ? ?Subjective ?Chief Complaint  ?Patient presents with  ? Abdominal Pain  ? ? ?HPI ?Anita Nguyen is a 71 y.o. female here today with complaint of RUQ and periumbilical abdominal pain.  Describes as a stabbing pain that started 3 days ago.  It has worsened since onset.  Pain radiates into her back.  She has had some nausea but denies vomiting.  Her bowel are moving normally.  She has not had fever or chills.  Denies urinary symptoms including frequency or hematuria.  She has not noted any rash.   ? ?ROS:  A comprehensive ROS was completed and negative except as noted per HPI ? ?Allergies  ?Allergen Reactions  ? Penicillins   ? ? ?Past Medical History:  ?Diagnosis Date  ? Allergy   ? Cataract   ? forming   ? DDD (degenerative disc disease)   ? Polymyalgia rheumatica ([place]) 2012  ? Tachycardia   ? Thyroid disease   ? ? ?Past Surgical History:  ?Procedure Laterality Date  ? BREAST LUMPECTOMY    ? BREAST REDUCTION SURGERY    ? CHOLECYSTECTOMY    ? COLONOSCOPY    ? 11 yr ago- no polyps per pt   ? LASIK    ? TUBAL LIGATION    ? ? ?Social History  ? ?Socioeconomic History  ? Marital status: Married  ?  Spouse name: Elta Guadeloupe  ? Number of children: 1  ? Years of education: 49  ? Highest education level: Bachelor's degree (e.g., BA, AB, BS)  ?Occupational History  ? Occupation: Optometrist   ?  Employer: [employer]  ?  Comment: retired  ?Tobacco Use  ? Smoking status: Never  ? Smokeless tobacco: Never  ?Vaping Use  ? Vaping Use: Never used  ?Substance and Sexual Activity  ? Alcohol use: No  ? Drug use: Never  ? Sexual activity: Yes  ?  Partners: Male  ?  Birth control/protection: Post-menopausal  ?Other Topics Concern  ? Not on file  ?Social History Narrative  ? Lives with her husband. She has one son. She enjoys gardening and yardwork.  ? ?Social Determinants of Health  ? ?Financial Resource Strain: Low Risk   ? Difficulty of Paying Living Expenses: Not hard at all   ?Food Insecurity: No Food Insecurity  ? Worried About Charity fundraiser in the Last Year: Never true  ? Ran Out of Food in the Last Year: Never true  ?Transportation Needs: No Transportation Needs  ? Lack of Transportation (Medical): No  ? Lack of Transportation (Non-Medical): No  ?Physical Activity: Inactive  ? Days of Exercise per Week: 0 days  ? Minutes of Exercise per Session: 0 min  ?Stress: No Stress Concern Present  ? Feeling of Stress : Not at all  ?Social Connections: Moderately Integrated  ? Frequency of Communication with Friends and Family: More than three times a week  ? Frequency of Social Gatherings with Friends and Family: Twice a week  ? Attends Religious Services: More than 4 times per year  ? Active Member of Clubs or Organizations: No  ? Attends Archivist Meetings: Never  ? Marital Status: Married  ? ? ?Family History  ?Problem Relation Age of Onset  ? Dementia Mother   ? Dementia Sister   ? Colon polyps Sister   ? Stomach cancer Father   ? Colon cancer Neg Hx   ? Esophageal cancer Neg Hx   ?  Rectal cancer Neg Hx   ? ? ?Health Maintenance  ?Topic Date Due  ? TETANUS/TDAP  12/27/2021 (Originally 02/17/1972)  ? Hepatitis C Screening  12/27/2021 (Originally 02/17/1971)  ? Zoster Vaccines- Shingrix (1 of 2) 08/21/2022 (Originally 02/17/2003)  ? COVID-19 Vaccine (3 - Booster for Moderna series) 09/06/2022 (Originally 08/08/2020)  ? MAMMOGRAM  11/01/2022 (Originally 08/29/2021)  ? INFLUENZA VACCINE  02/12/2022  ? COLONOSCOPY (Pts 45-49yr Insurance coverage will need to be confirmed)  10/05/2026  ? Pneumonia Vaccine 69 Years old  Completed  ? DEXA SCAN  Completed  ? HPV VACCINES  Aged Out  ? ? ? ?----------------------------------------------------------------------------------------------------------------------------------------------------------------------------------------------------------------- ?Physical Exam ?BP 122/80   Pulse 75   Wt 204 lb (92.5 kg)   SpO2 96%   BMI 31.02 kg/m?   ? ?Physical Exam ?Constitutional:   ?   Appearance: She is well-developed.  ?HENT:  ?   Head: Normocephalic and atraumatic.  ?Eyes:  ?   General: No scleral icterus. ?Cardiovascular:  ?   Rate and Rhythm: Normal rate and regular rhythm.  ?Pulmonary:  ?   Effort: Pulmonary effort is normal.  ?   Breath sounds: Normal breath sounds.  ?Abdominal:  ?   General: Abdomen is flat. There is no distension.  ?   Palpations: Abdomen is soft. There is no mass.  ?   Tenderness: There is abdominal tenderness (RUQ and epigastric tenderness.  No CVA tenderness.). There is no guarding or rebound.  ?Musculoskeletal:  ?   Cervical back: Neck supple.  ?Neurological:  ?   General: No focal deficit present.  ?   Mental Status: She is alert.  ?Psychiatric:     ?   Mood and Affect: Mood normal.     ?   Behavior: Behavior normal.  ? ? ?------------------------------------------------------------------------------------------------------------------------------------------------------------------------------------------------------------------- ?Assessment and Plan ? ?Right upper quadrant abdominal pain ?RUQ and periumbilical pain concerning for acute pancreatitis. She has had cholecystectomy.  Stat labs and stat CT abdomen ordered today for further evaluation.  I did think of shingles in her ddx as the pain is dermatomal in distribution as well.  She has no rash as this time but I asked her to monitor for this.   ? ? ?No orders of the defined types were placed in this encounter. ? ? ?No follow-ups on file. ? ? ? ?This visit occurred during the SARS-CoV-2 public health emergency.  Safety protocols were in place, including screening questions prior to the visit, additional usage of staff PPE, and extensive cleaning of exam room while observing appropriate contact time as indicated for disinfecting solutions.  ? ?

## 2021-11-21 ENCOUNTER — Telehealth: Payer: Self-pay

## 2021-11-21 MED ORDER — GABAPENTIN 300 MG PO CAPS: 300.0000 mg | ORAL_CAPSULE | Freq: Three times a day (TID) | ORAL | 3 refills | Status: AC

## 2021-11-21 NOTE — Addendum Note (Signed)
Addended by: Peggye Ley on: 11/21/2021 12:14 PM ? ? Modules accepted: Orders ? ?

## 2021-11-21 NOTE — Telephone Encounter (Signed)
Pt has been advised of treatment plan. Agrees to plan. She has added a heating pad to help with pain. Pain today has been better than yesterday. Requesting refill of Valtrex. ?

## 2021-11-21 NOTE — Telephone Encounter (Signed)
Pt would like to know about pain medication alternatives for shingles pain relief. States she is using tramadol sparingly and has 14 pills left. Prednisone has worked the best for her relief. Requesting another med for pain relief. Rash has developed and she has started taking Valtrex.  ? ?They would also like to know what the long term medications would be for shingles. ?

## 2021-11-21 NOTE — Telephone Encounter (Signed)
Pt has been advised concerning Valtrex. Agrees to treatment plan. ?

## 2022-01-21 ENCOUNTER — Other Ambulatory Visit: Payer: Self-pay

## 2022-01-21 DIAGNOSIS — E039 Hypothyroidism, unspecified: Secondary | ICD-10-CM

## 2022-01-21 MED ORDER — LEVOTHYROXINE SODIUM 150 MCG PO TABS: 150.0000 ug | ORAL_TABLET | Freq: Every day | ORAL | 3 refills | Status: AC

## 2022-05-21 LAB — HM MAMMOGRAPHY

## 2022-05-23 ENCOUNTER — Other Ambulatory Visit (HOSPITAL_COMMUNITY): Payer: Self-pay | Admitting: Family Medicine

## 2022-05-23 ENCOUNTER — Other Ambulatory Visit: Payer: Self-pay | Admitting: Family Medicine

## 2022-05-23 DIAGNOSIS — Z8639 Personal history of other endocrine, nutritional and metabolic disease: Secondary | ICD-10-CM

## 2022-05-23 DIAGNOSIS — R1310 Dysphagia, unspecified: Secondary | ICD-10-CM

## 2022-05-23 DIAGNOSIS — E039 Hypothyroidism, unspecified: Secondary | ICD-10-CM

## 2022-05-27 ENCOUNTER — Ambulatory Visit
Admission: RE | Admit: 2022-05-27 | Discharge: 2022-05-27 | Disposition: A | Discharge status: Home / Self Care | Payer: Medicare Other | Source: Ambulatory Visit | Attending: Family Medicine | Admitting: Family Medicine

## 2022-05-27 DIAGNOSIS — E039 Hypothyroidism, unspecified: Secondary | ICD-10-CM

## 2022-05-27 DIAGNOSIS — R1310 Dysphagia, unspecified: Secondary | ICD-10-CM | POA: Insufficient documentation

## 2022-05-27 DIAGNOSIS — Z8639 Personal history of other endocrine, nutritional and metabolic disease: Secondary | ICD-10-CM | POA: Diagnosis present

## 2022-05-30 ENCOUNTER — Encounter: Payer: Self-pay | Admitting: Family Medicine

## 2022-05-30 NOTE — Progress Notes (Unsigned)
[id number] 

## 2022-09-24 ENCOUNTER — Ambulatory Visit: Payer: Medicare HMO

## 2022-09-24 DIAGNOSIS — K2100 Gastro-esophageal reflux disease with esophagitis, without bleeding: Secondary | ICD-10-CM | POA: Diagnosis present

## 2022-09-24 DIAGNOSIS — K2970 Gastritis, unspecified, without bleeding: Secondary | ICD-10-CM | POA: Diagnosis not present

## 2022-09-24 DIAGNOSIS — R1310 Dysphagia, unspecified: Secondary | ICD-10-CM | POA: Diagnosis not present

## 2022-11-22 IMAGING — CT CT ABD-PELV W/ CM
2 of 5 series · 16 of 46 positions shown, 18 images · IV contrast (agent unspecified)
Comparison: None Available.

CLINICAL DATA: Pancreatitis suspected. Periumbilical abdominal pain
and right upper quadrant pain.

EXAM:
CT ABDOMEN AND PELVIS WITH CONTRAST
TECHNIQUE: Multidetector CT imaging of the abdomen and pelvis was performed
using the standard protocol following bolus administration of
intravenous contrast.

[Series 2: axial st · axial · 0.98mm/px · z∈[-453,-23]mm · 13 of 98 slices shown, 15 images]
[im 6/98  soft-tissue]
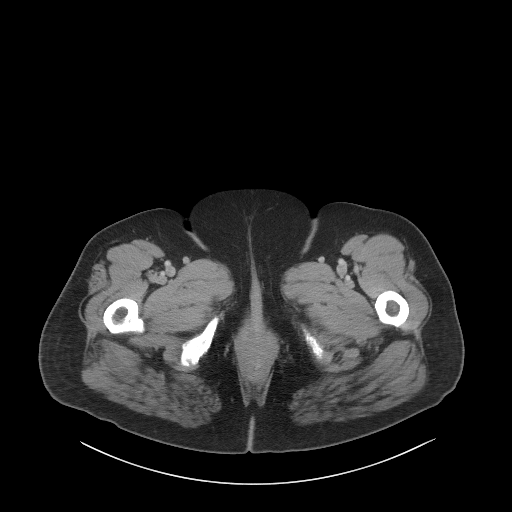
[im 6/98  bone]
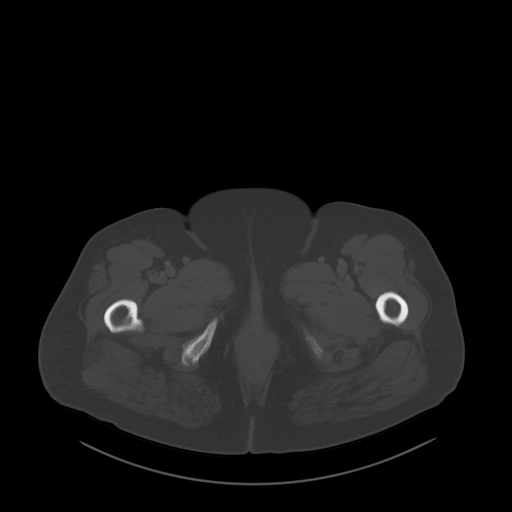
[im 16/98  soft-tissue]
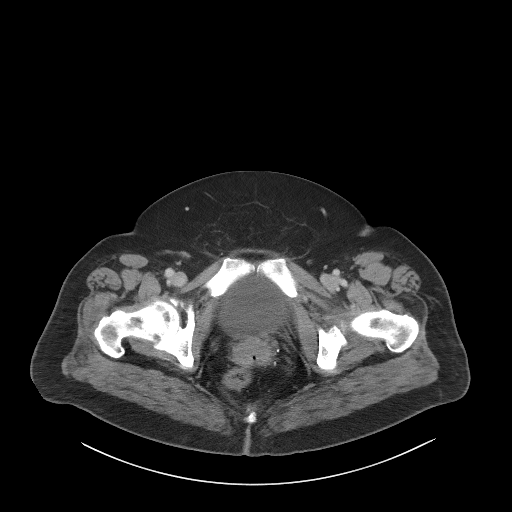
[im 21/98  soft-tissue]
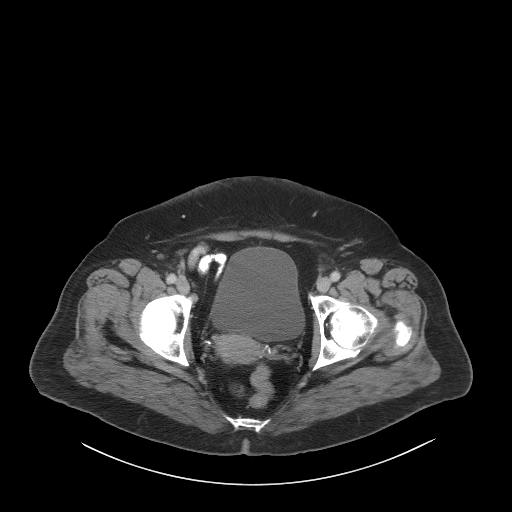
[im 26/98  soft-tissue]
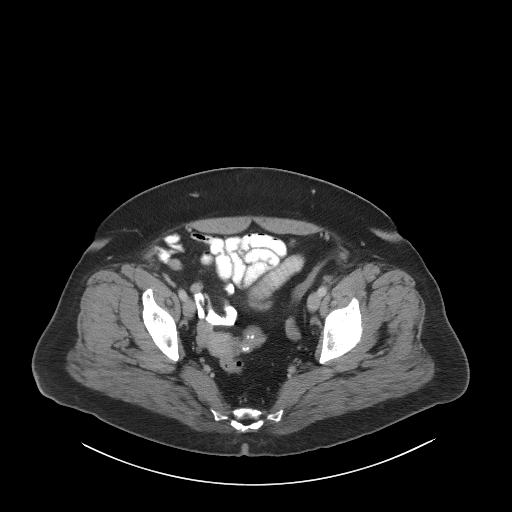
[im 36/98  soft-tissue]
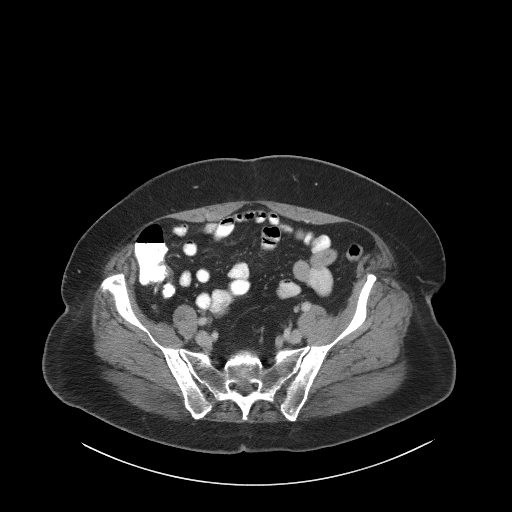
[im 41/98  soft-tissue]
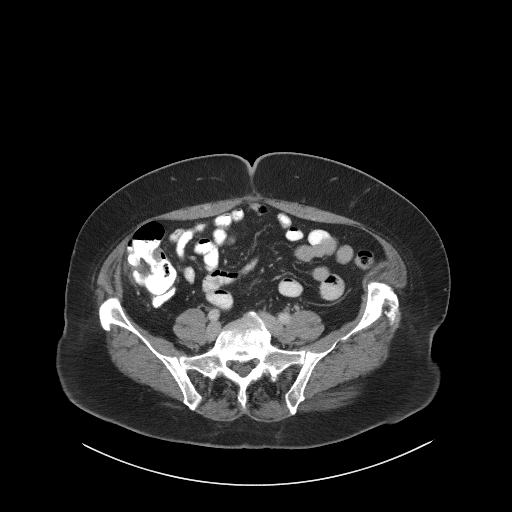
[im 52/98  soft-tissue]
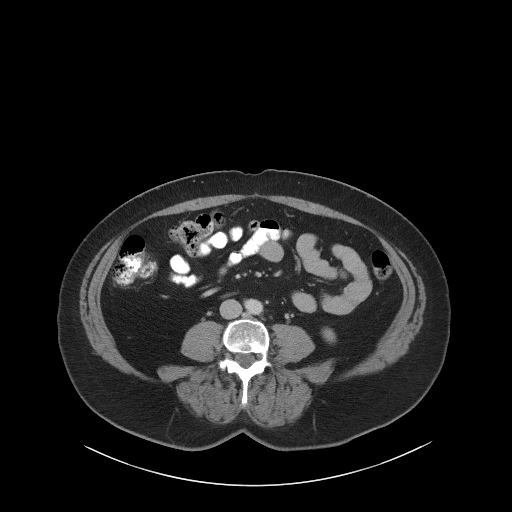
[im 57/98  soft-tissue]
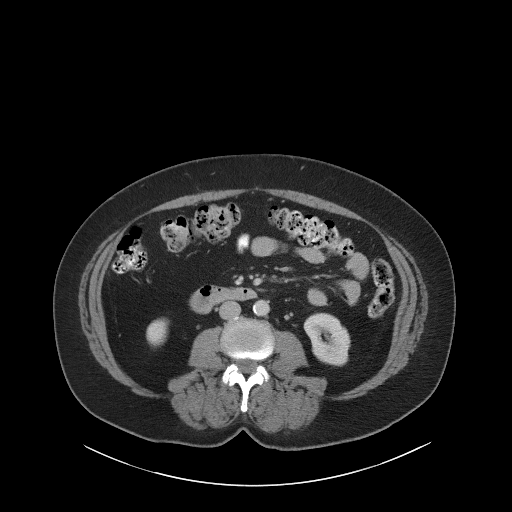
[im 62/98  soft-tissue]
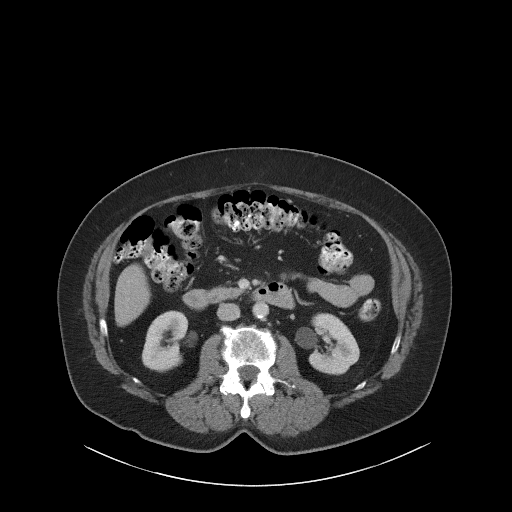
[im 62/98  bone]
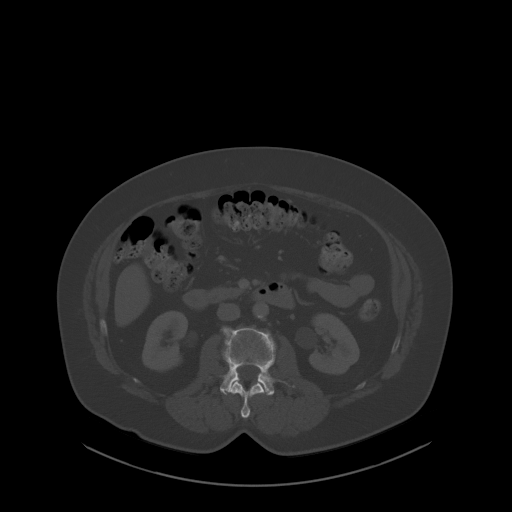
[im 72/98  soft-tissue]
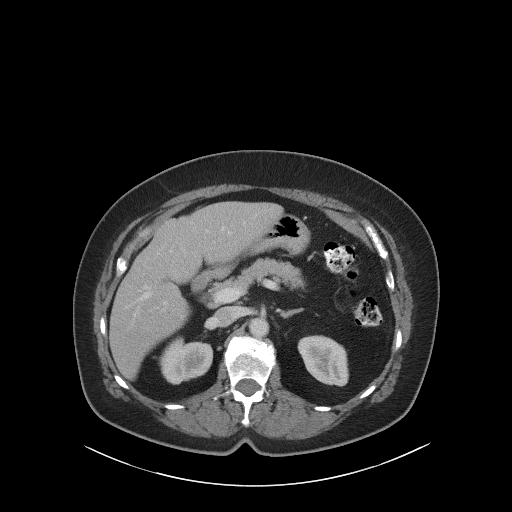
[im 77/98  soft-tissue]
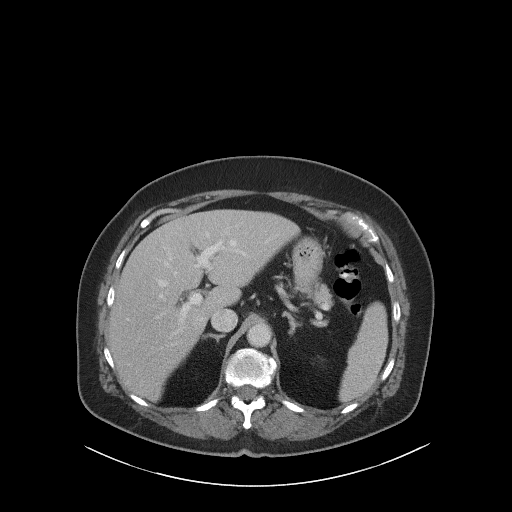
[im 82/98  soft-tissue]
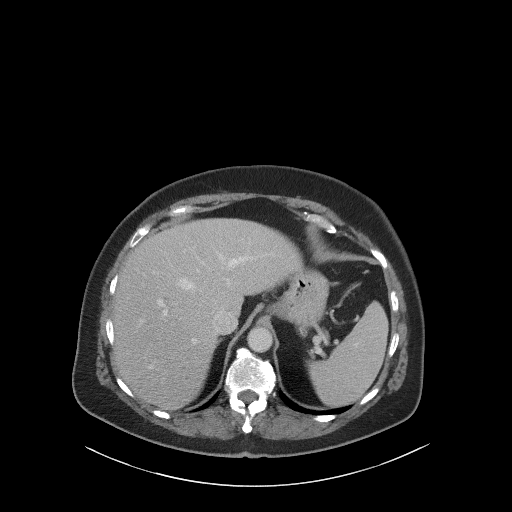
[im 92/98  soft-tissue]
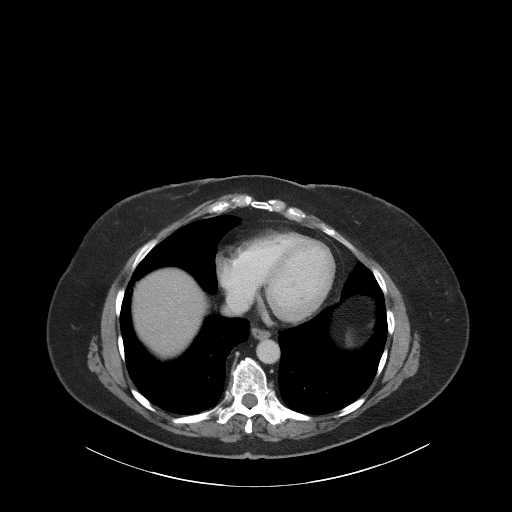

[Series 5: coronal st · coronal · 0.87mm/px · 3 of 122 slices shown]
[im 41/122  soft-tissue]
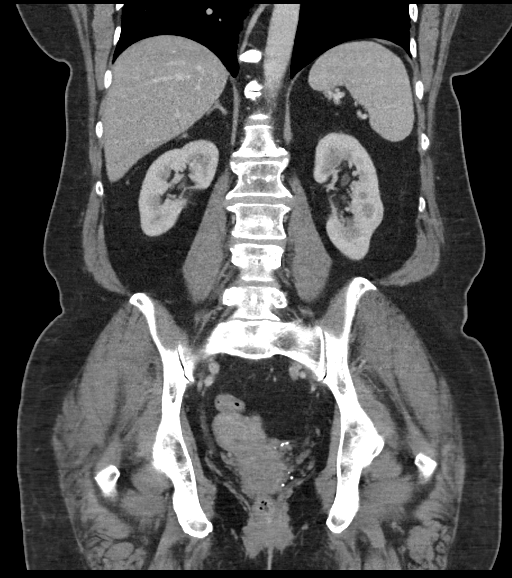
[im 54/122  soft-tissue]
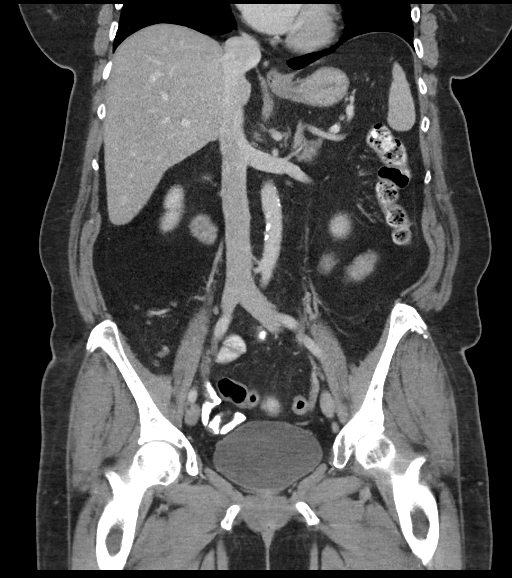
[im 68/122  soft-tissue]
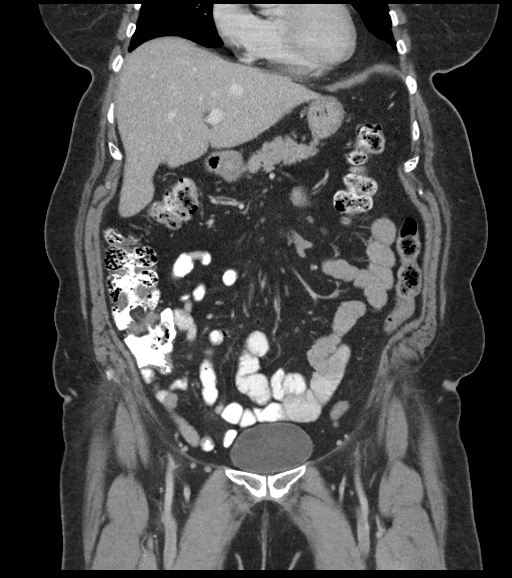

[16 of 46 positions shown; findings below may reference images not displayed]

RADIATION DOSE REDUCTION: This exam was performed according to the
departmental dose-optimization program which includes automated
exposure control, adjustment of the mA and/or kV according to
patient size and/or use of iterative reconstruction technique.

CONTRAST:  100mL OMNIPAQUE IOHEXOL 300 MG/ML  SOLN
FINDINGS: Lower chest: Lung bases are clear.  No pleural or pericardial fluid.

Hepatobiliary: Liver parenchyma is normal except for a 14 mm
slightly lower density focus adjacent to gallbladder fossa that
probably represents some focal fatty change. Previous
cholecystectomy.

Pancreas: Normal appearance of the pancreas. No evidence of acute or
chronic pancreatitis.

Spleen: Normal

Adrenals/Urinary Tract: Adrenal glands are normal. Kidneys are
normal. No cyst, mass, stone or hydronephrosis. Bladder is normal.

Stomach/Bowel: Stomach and small intestine are normal. Normal
appendix. No abnormal colon finding.

Vascular/Lymphatic: Mild aortic atherosclerosis. No aneurysm. IVC is
normal. No adenopathy.

Reproductive: Small uterine leiomyomas. No significant finding. No
ovarian abnormality.

Other: No free fluid or air.

Musculoskeletal: Ordinary lower lumbar degenerative changes.
IMPRESSION: Normal appearing pancreas.

Previous cholecystectomy.

Aortic atherosclerosis, mild.

Small leiomyomas of the uterus.

## 2023-05-20 ENCOUNTER — Other Ambulatory Visit: Payer: Self-pay | Admitting: Family Medicine

## 2023-05-20 DIAGNOSIS — R9389 Abnormal findings on diagnostic imaging of other specified body structures: Secondary | ICD-10-CM

## 2023-05-20 DIAGNOSIS — E041 Nontoxic single thyroid nodule: Secondary | ICD-10-CM

## 2023-05-30 ENCOUNTER — Ambulatory Visit
Admission: RE | Admit: 2023-05-30 | Discharge: 2023-05-30 | Disposition: A | Discharge status: Home / Self Care | Payer: Medicare HMO | Source: Ambulatory Visit | Attending: Family Medicine | Admitting: Family Medicine

## 2023-05-30 DIAGNOSIS — E041 Nontoxic single thyroid nodule: Secondary | ICD-10-CM | POA: Diagnosis present

## 2023-05-30 DIAGNOSIS — R9389 Abnormal findings on diagnostic imaging of other specified body structures: Secondary | ICD-10-CM | POA: Diagnosis present

## 2024-07-01 ENCOUNTER — Other Ambulatory Visit: Payer: Self-pay | Admitting: Family Medicine

## 2024-07-01 DIAGNOSIS — E041 Nontoxic single thyroid nodule: Secondary | ICD-10-CM

## 2024-07-12 ENCOUNTER — Ambulatory Visit
Admission: RE | Admit: 2024-07-12 | Discharge: 2024-07-12 | Disposition: A | Discharge status: Home / Self Care | Source: Ambulatory Visit | Attending: Family Medicine | Admitting: Family Medicine

## 2024-07-12 DIAGNOSIS — E041 Nontoxic single thyroid nodule: Secondary | ICD-10-CM | POA: Insufficient documentation
